# Patient Record
Sex: Male | Born: 1985 | Race: White | Hispanic: No | Marital: Married | Smoking: Never smoker
Health system: Southern US, Community
[De-identification: ages and names within clinical notes are randomized; demographics above are authoritative.]

## PROBLEM LIST (undated history)

## (undated) DIAGNOSIS — R112 Nausea with vomiting, unspecified: Secondary | ICD-10-CM

## (undated) DIAGNOSIS — M5430 Sciatica, unspecified side: Secondary | ICD-10-CM

## (undated) DIAGNOSIS — Z9889 Other specified postprocedural states: Secondary | ICD-10-CM

## (undated) DIAGNOSIS — R519 Headache, unspecified: Secondary | ICD-10-CM

## (undated) DIAGNOSIS — R51 Headache: Secondary | ICD-10-CM

## (undated) HISTORY — PX: OTHER SURGICAL HISTORY: SHX169

---

## 2012-07-15 ENCOUNTER — Emergency Department (HOSPITAL_BASED_OUTPATIENT_CLINIC_OR_DEPARTMENT_OTHER)
Admission: EM | Admit: 2012-07-15 | Discharge: 2012-07-15 | Disposition: A | Discharge status: Home / Self Care | Payer: BC Managed Care – PPO | Attending: Emergency Medicine | Admitting: Emergency Medicine

## 2012-07-15 ENCOUNTER — Encounter (HOSPITAL_BASED_OUTPATIENT_CLINIC_OR_DEPARTMENT_OTHER): Payer: Self-pay | Admitting: *Deleted

## 2012-07-15 ENCOUNTER — Emergency Department (HOSPITAL_BASED_OUTPATIENT_CLINIC_OR_DEPARTMENT_OTHER): Payer: BC Managed Care – PPO

## 2012-07-15 DIAGNOSIS — M545 Low back pain, unspecified: Secondary | ICD-10-CM | POA: Insufficient documentation

## 2012-07-15 DIAGNOSIS — M549 Dorsalgia, unspecified: Secondary | ICD-10-CM

## 2012-07-15 MED ORDER — OXYCODONE-ACETAMINOPHEN 5-325 MG PO TABS: 2.0000 | ORAL_TABLET | ORAL | Status: DC | PRN

## 2012-07-15 MED ORDER — DIAZEPAM 5 MG PO TABS: 5.0000 mg | ORAL_TABLET | Freq: Two times a day (BID) | ORAL | Status: DC

## 2012-07-15 MED ORDER — DIAZEPAM 5 MG/ML IJ SOLN
5.0000 mg | Freq: Once | INTRAMUSCULAR | Status: AC
  Administered 2012-07-15: 5 mg via INTRAMUSCULAR
  Filled 2012-07-15: qty 2

## 2012-07-15 MED ORDER — KETOROLAC TROMETHAMINE 30 MG/ML IJ SOLN
INTRAMUSCULAR | Status: AC
  Filled 2012-07-15: qty 1

## 2012-07-15 MED ORDER — KETOROLAC TROMETHAMINE 30 MG/ML IJ SOLN
60.0000 mg | Freq: Once | INTRAMUSCULAR | Status: AC
  Administered 2012-07-15: 60 mg via INTRAMUSCULAR
  Filled 2012-07-15: qty 1

## 2012-07-15 NOTE — ED Provider Notes (Signed)
History     CSN: [id number]  Arrival date & time 07/15/12  1844   First MD Initiated Contact with Patient 07/15/12 1839      Chief Complaint  Patient presents with  . Back Pain    (Consider location/radiation/quality/duration/timing/severity/associated sxs/prior treatment) HPI Comments: Pt states that he placed his daughter in her crib and he was unable to stand back up due to pain:pt denies fever,numbness, weakness or dysuria  Patient is a 27 y.o. male presenting with back pain. The history is provided by the patient. No language interpreter was used.  Back Pain Location:  Lumbar spine Quality:  Aching Pain severity:  Severe Onset quality:  Sudden Timing:  Constant Progression:  Unchanged Context: twisting   Relieved by:  Nothing Ineffective treatments:  Ibuprofen, heating pad and NSAIDs Associated symptoms: no bladder incontinence, no bowel incontinence, no numbness, no tingling and no weakness     History reviewed. No pertinent past medical history.  Past Surgical History  Procedure Laterality Date  . Knee surgery      History reviewed. No pertinent family history.  History  Substance Use Topics  . Smoking status: Never Smoker   . Smokeless tobacco: Not on file  . Alcohol Use: No      Review of Systems  Constitutional: Negative.   Respiratory: Negative.   Cardiovascular: Negative.   Gastrointestinal: Negative for bowel incontinence.  Genitourinary: Negative for bladder incontinence.  Musculoskeletal: Positive for back pain.  Neurological: Negative for tingling, weakness and numbness.    Allergies  Review of patient's allergies indicates no known allergies.  Home Medications  No current outpatient prescriptions on file.  There were no vitals taken for this visit.  Physical Exam  Nursing note and vitals reviewed. Constitutional: He is oriented to person, place, and time. He appears well-developed and well-nourished.  HENT:  Head: Normocephalic  and atraumatic.  Eyes: Pupils are equal, round, and reactive to light.  Neck: Normal range of motion. Neck supple.  Cardiovascular: Normal rate.   Pulmonary/Chest: Effort normal and breath sounds normal.  Musculoskeletal:  Lumbar spinal and paraspinal tenderness:pt has full rom of all extremities:no weakness noted  Neurological: He is alert and oriented to person, place, and time. He exhibits abnormal muscle tone. Coordination normal.  Skin: Skin is warm and dry.  Psychiatric: He has a normal mood and affect.    ED Course  Procedures (including critical care time)  Labs Reviewed - No data to display Dg Lumbar Spine Complete  07/15/2012  *RADIOLOGY REPORT*  Clinical Data: Back pain  LUMBAR SPINE - COMPLETE 4+ VIEW  Comparison: None.  Findings: Five lumbar-type vertebral bodies.  Straightening of the lumbar spine.  No evidence of fracture or dislocation.  The vertebral body heights and intervertebral disc spaces are maintained.  The visualized bony pelvis appears intact.  IMPRESSION: No fracture or dislocation is seen.   Original Report Authenticated By: Charline Bills, [name]      1. Back pain       MDM  Pt is ambulating standing straight MW:NUUV send home with symptomatic treatment:pt is okay to follow up with Dr. Pearletha Forge for continued symptoms:pt is not having any neuro deficits        Teressa Lower, NP 07/15/12 1957  Teressa Lower, NP 07/15/12 1959

## 2012-07-15 NOTE — ED Provider Notes (Signed)
Medical screening examination/treatment/procedure(s) were performed by non-physician practitioner and as supervising physician I was immediately available for consultation/collaboration.   Rolan Bucco, MD 07/15/12 2018

## 2012-07-15 NOTE — ED Notes (Signed)
1100  Mid back pain started states feels like a bowling ball in middle of his back radiated to mid buttocks and thighs  ems arrived pt lying on the floor

## 2014-11-09 ENCOUNTER — Encounter (HOSPITAL_COMMUNITY): Payer: Self-pay | Admitting: *Deleted

## 2014-11-09 ENCOUNTER — Ambulatory Visit (HOSPITAL_COMMUNITY)
Admission: RE | Admit: 2014-11-09 | Discharge: 2014-11-09 | Disposition: A | Discharge status: Home / Self Care | Payer: BC Managed Care – PPO | Source: Ambulatory Visit | Attending: Specialist | Admitting: Specialist

## 2014-11-09 ENCOUNTER — Ambulatory Visit: Payer: Self-pay | Admitting: Orthopedic Surgery

## 2014-11-09 NOTE — Progress Notes (Signed)
Please put orders in Epic for Same day surgery tomorrow, July 02-16 Thanks

## 2014-11-09 NOTE — Anesthesia Preprocedure Evaluation (Addendum)
Anesthesia Evaluation  Patient identified by MRN, date of birth, ID band Patient awake    Reviewed: Allergy & Precautions, NPO status , Patient's Chart, lab work & pertinent test results  Airway Mallampati: II  TM Distance: >3 FB Neck ROM: Full    Dental no notable dental hx.    Pulmonary neg pulmonary ROS,  breath sounds clear to auscultation  Pulmonary exam normal       Cardiovascular negative cardio ROS Normal cardiovascular examRhythm:Regular Rate:Normal     Neuro/Psych negative neurological ROS  negative psych ROS   GI/Hepatic negative GI ROS, Neg liver ROS,   Endo/Other  negative endocrine ROS  Renal/GU negative Renal ROS  negative genitourinary   Musculoskeletal negative musculoskeletal ROS (+)   Abdominal   Peds negative pediatric ROS (+)  Hematology negative hematology ROS (+)   Anesthesia Other Findings   Reproductive/Obstetrics negative OB ROS                             Anesthesia Physical Anesthesia Plan  ASA: II  Anesthesia Plan: General   Post-op Pain Management:    Induction: Intravenous  Airway Management Planned: Oral ETT  Additional Equipment:   Intra-op Plan:   Post-operative Plan: Extubation in OR  Informed Consent: I have reviewed the patients History and Physical, chart, labs and discussed the procedure including the risks, benefits and alternatives for the proposed anesthesia with the patient or authorized representative who has indicated his/her understanding and acceptance.   Dental advisory given  Plan Discussed with: CRNA  Anesthesia Plan Comments:         Anesthesia Quick Evaluation  

## 2014-11-09 NOTE — H&P (Signed)
Jeremy Ball is an 29 y.o. male.   Chief Complaint: back and leg pain HPI: PT seen in office today with 2 years back and leg pain (left initially, now progressed to right), intermittent, progressively worsening x several months with increased numbness, now with weakness in the leg, refractory to medications, activity modifications, relative rest.  No past medical history on file.  Past Surgical History  Procedure Laterality Date  . Knee surgery      No family history on file. Social History:  reports that he has never smoked. He does not have any smokeless tobacco history on file. He reports that he does not drink alcohol or use illicit drugs.  Allergies: No Known Allergies   (Not in a hospital admission)  No results found for this or any previous visit (from the past 48 hour(s)). No results found.  Review of Systems  Constitutional: Negative.   HENT: Negative.   Eyes: Negative.   Respiratory: Negative.   Cardiovascular: Negative.   Gastrointestinal: Negative.   Genitourinary: Negative.   Musculoskeletal: Positive for back pain.  Skin: Negative.   Neurological: Positive for sensory change and focal weakness.    There were no vitals taken for this visit. Physical Exam  Constitutional: He is oriented to person, place, and time. He appears well-developed and well-nourished.  HENT:  Head: Normocephalic.  Eyes: Pupils are equal, round, and reactive to light.  Neck: Normal range of motion.  Cardiovascular: Normal rate.   Respiratory: Effort normal.  GI: Soft.  Musculoskeletal:  + SLR left EHL weakness left Numbness L5 distribution No loss of rectal sphincter tone  Neurological: He is alert and oriented to person, place, and time.  Skin: Skin is warm and dry.  Psychiatric: He has a normal mood and affect.    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 Given his exam, neural tension signs, weakness, progressive symptoms, recommend proceeding with lumbar decompression L4-5, L5-S1 urgently tomorrow. Dr. Shelle IronBeane discussed risks, complications and alternatives with the pt. I had an extensive discussion of the risks and benefits of the lumbar decompression with the patient including bleeding, infection, damage to neurovascular structures, epidural fibrosis, CSF leak requiring repair. We also discussed increase in pain, adjacent segment disease, recurrent disc herniation, need for future surgery including repeat decompression and/or fusion. We also discussed risks of postoperative hematoma, paralysis, anesthetic complications including DVT, PE, death, cardiopulmonary dysfunction. In addition, the perioperative and postoperative courses were discussed in detail including the rehabilitative time and return to functional activity and work. I provided the patient with an illustrated handout and utilized the appropriate surgical models.  Plan microlumbar decompression L4-5, L5-S1  Jeremy Ball M. PA-C for Dr. Shelle IronBeane 11/09/2014, 2:28 PM

## 2014-11-09 NOTE — Progress Notes (Signed)
[facility] Dr. Shelle IronBeane requesting orders

## 2014-11-10 ENCOUNTER — Encounter (HOSPITAL_COMMUNITY): Payer: Self-pay | Admitting: *Deleted

## 2014-11-10 ENCOUNTER — Ambulatory Visit (HOSPITAL_COMMUNITY): Payer: BC Managed Care – PPO | Admitting: Anesthesiology

## 2014-11-10 ENCOUNTER — Ambulatory Visit (HOSPITAL_COMMUNITY): Payer: BC Managed Care – PPO

## 2014-11-10 ENCOUNTER — Ambulatory Visit (HOSPITAL_COMMUNITY)
Admission: RE | Admit: 2014-11-10 | Discharge: 2014-11-11 | Disposition: A | Discharge status: Home / Self Care | Payer: BC Managed Care – PPO | Source: Ambulatory Visit | Attending: Specialist | Admitting: Specialist

## 2014-11-10 ENCOUNTER — Encounter (HOSPITAL_COMMUNITY)
Admission: RE | Disposition: A | Discharge status: Home / Self Care | Payer: Self-pay | Source: Ambulatory Visit | Attending: Specialist

## 2014-11-10 DIAGNOSIS — Z79899 Other long term (current) drug therapy: Secondary | ICD-10-CM | POA: Insufficient documentation

## 2014-11-10 DIAGNOSIS — M545 Low back pain, unspecified: Secondary | ICD-10-CM | POA: Diagnosis present

## 2014-11-10 DIAGNOSIS — R531 Weakness: Secondary | ICD-10-CM | POA: Diagnosis not present

## 2014-11-10 DIAGNOSIS — M4806 Spinal stenosis, lumbar region: Secondary | ICD-10-CM | POA: Diagnosis present

## 2014-11-10 DIAGNOSIS — M5127 Other intervertebral disc displacement, lumbosacral region: Secondary | ICD-10-CM | POA: Insufficient documentation

## 2014-11-10 DIAGNOSIS — M5116 Intervertebral disc disorders with radiculopathy, lumbar region: Secondary | ICD-10-CM | POA: Insufficient documentation

## 2014-11-10 DIAGNOSIS — M5126 Other intervertebral disc displacement, lumbar region: Secondary | ICD-10-CM | POA: Diagnosis present

## 2014-11-10 DIAGNOSIS — Z419 Encounter for procedure for purposes other than remedying health state, unspecified: Secondary | ICD-10-CM

## 2014-11-10 HISTORY — PX: LUMBAR LAMINECTOMY/DECOMPRESSION MICRODISCECTOMY: SHX5026

## 2014-11-10 LAB — CBC
HEMATOCRIT: 48 % (ref 39.0–52.0)
HEMOGLOBIN: 15.9 g/dL (ref 13.0–17.0)
MCH: 28.1 pg (ref 26.0–34.0)
MCHC: 33.1 g/dL (ref 30.0–36.0)
MCV: 85 fL (ref 78.0–100.0)
Platelets: 260 10*3/uL (ref 150–400)
RBC: 5.65 MIL/uL (ref 4.22–5.81)
RDW: 12.8 % (ref 11.5–15.5)
WBC: 6.5 10*3/uL (ref 4.0–10.5)

## 2014-11-10 LAB — BASIC METABOLIC PANEL
Anion gap: 9 (ref 5–15)
BUN: 24 mg/dL — AB (ref 6–20)
CHLORIDE: 104 mmol/L (ref 101–111)
CO2: 25 mmol/L (ref 22–32)
CREATININE: 1.14 mg/dL (ref 0.61–1.24)
Calcium: 9.1 mg/dL (ref 8.9–10.3)
GFR calc non Af Amer: >60 mL/min (ref >60)
Glucose, Bld: 97 mg/dL (ref 65–99)
Potassium: 3.8 mmol/L (ref 3.5–5.1)
SODIUM: 138 mmol/L (ref 135–145)

## 2014-11-10 SURGERY — LUMBAR LAMINECTOMY/DECOMPRESSION MICRODISCECTOMY 2 LEVELS
Anesthesia: General

## 2014-11-10 MED ORDER — MIDAZOLAM HCL 5 MG/5ML IJ SOLN
INTRAMUSCULAR | Status: DC | PRN
  Administered 2014-11-10: 2 mg via INTRAVENOUS

## 2014-11-10 MED ORDER — BUPIVACAINE-EPINEPHRINE (PF) 0.5% -1:200000 IJ SOLN
INTRAMUSCULAR | Status: DC | PRN
  Administered 2014-11-10: 15 mL

## 2014-11-10 MED ORDER — DEXTROSE 5 % IV SOLN
3.0000 g | INTRAVENOUS | Status: AC
  Administered 2014-11-10: 3 g via INTRAVENOUS
  Filled 2014-11-10: qty 3000

## 2014-11-10 MED ORDER — OXYCODONE-ACETAMINOPHEN 5-325 MG PO TABS
1.0000 | ORAL_TABLET | ORAL | Status: DC | PRN
  Administered 2014-11-10 – 2014-11-11 (×3): 2 via ORAL
  Filled 2014-11-10 (×3): qty 2

## 2014-11-10 MED ORDER — SODIUM CHLORIDE 0.9 % IR SOLN
Status: DC | PRN
  Administered 2014-11-10: 500 mL

## 2014-11-10 MED ORDER — KCL IN DEXTROSE-NACL 20-5-0.45 MEQ/L-%-% IV SOLN
INTRAVENOUS | Status: DC
  Administered 2014-11-10: 13:00:00 via INTRAVENOUS
  Filled 2014-11-10 (×2): qty 1000

## 2014-11-10 MED ORDER — FENTANYL CITRATE (PF) 100 MCG/2ML IJ SOLN
INTRAMUSCULAR | Status: DC | PRN
  Administered 2014-11-10: 50 ug via INTRAVENOUS
  Administered 2014-11-10: 100 ug via INTRAVENOUS
  Administered 2014-11-10: 50 ug via INTRAVENOUS

## 2014-11-10 MED ORDER — PROPOFOL 10 MG/ML IV BOLUS
INTRAVENOUS | Status: DC | PRN
  Administered 2014-11-10: 250 mg via INTRAVENOUS

## 2014-11-10 MED ORDER — METHOCARBAMOL 1000 MG/10ML IJ SOLN
500.0000 mg | Freq: Four times a day (QID) | INTRAVENOUS | Status: DC | PRN
  Filled 2014-11-10: qty 5

## 2014-11-10 MED ORDER — HYDROMORPHONE HCL 1 MG/ML IJ SOLN
0.5000 mg | INTRAMUSCULAR | Status: DC | PRN
  Administered 2014-11-10: 0.5 mg via INTRAVENOUS
  Filled 2014-11-10: qty 1

## 2014-11-10 MED ORDER — METHOCARBAMOL 500 MG PO TABS
500.0000 mg | ORAL_TABLET | Freq: Four times a day (QID) | ORAL | Status: DC | PRN
  Administered 2014-11-10 – 2014-11-11 (×2): 500 mg via ORAL
  Filled 2014-11-10 (×2): qty 1

## 2014-11-10 MED ORDER — HYDROMORPHONE HCL 1 MG/ML IJ SOLN
INTRAMUSCULAR | Status: AC
  Administered 2014-11-10: 0.5 mg via INTRAVENOUS
  Filled 2014-11-10: qty 1

## 2014-11-10 MED ORDER — ONDANSETRON HCL 4 MG/2ML IJ SOLN
INTRAMUSCULAR | Status: DC | PRN
  Administered 2014-11-10: 4 mg via INTRAVENOUS

## 2014-11-10 MED ORDER — HYDROMORPHONE HCL 1 MG/ML IJ SOLN
0.2500 mg | INTRAMUSCULAR | Status: DC | PRN
  Administered 2014-11-10: 0.5 mg via INTRAVENOUS

## 2014-11-10 MED ORDER — DEXAMETHASONE SODIUM PHOSPHATE 10 MG/ML IJ SOLN
INTRAMUSCULAR | Status: AC
  Filled 2014-11-10: qty 1

## 2014-11-10 MED ORDER — SODIUM CHLORIDE 0.9 % IR SOLN
Status: AC
  Filled 2014-11-10: qty 1

## 2014-11-10 MED ORDER — CLINDAMYCIN PHOSPHATE 900 MG/50ML IV SOLN
INTRAVENOUS | Status: AC
  Filled 2014-11-10: qty 50

## 2014-11-10 MED ORDER — METHOCARBAMOL 500 MG PO TABS: 500.0000 mg | ORAL_TABLET | Freq: Three times a day (TID) | ORAL | Status: DC | PRN

## 2014-11-10 MED ORDER — CLINDAMYCIN PHOSPHATE 900 MG/50ML IV SOLN
900.0000 mg | INTRAVENOUS | Status: AC
  Administered 2014-11-10: 900 mg via INTRAVENOUS

## 2014-11-10 MED ORDER — CEFAZOLIN SODIUM 10 G IJ SOLR
3.0000 g | Freq: Three times a day (TID) | INTRAMUSCULAR | Status: AC
  Administered 2014-11-10 – 2014-11-11 (×3): 3 g via INTRAVENOUS
  Filled 2014-11-10 (×3): qty 3000

## 2014-11-10 MED ORDER — RISAQUAD PO CAPS
1.0000 | ORAL_CAPSULE | Freq: Every day | ORAL | Status: DC
  Administered 2014-11-10: 1 via ORAL
  Filled 2014-11-10 (×2): qty 1

## 2014-11-10 MED ORDER — PROPOFOL 10 MG/ML IV BOLUS
INTRAVENOUS | Status: AC
  Filled 2014-11-10: qty 20

## 2014-11-10 MED ORDER — NEOSTIGMINE METHYLSULFATE 10 MG/10ML IV SOLN
INTRAVENOUS | Status: AC
  Filled 2014-11-10: qty 1

## 2014-11-10 MED ORDER — MIDAZOLAM HCL 2 MG/2ML IJ SOLN
INTRAMUSCULAR | Status: AC
  Filled 2014-11-10: qty 2

## 2014-11-10 MED ORDER — HYDROCODONE-ACETAMINOPHEN 5-325 MG PO TABS: 1.0000 | ORAL_TABLET | ORAL | Status: DC | PRN

## 2014-11-10 MED ORDER — DEXAMETHASONE SODIUM PHOSPHATE 10 MG/ML IJ SOLN
INTRAMUSCULAR | Status: DC | PRN
  Administered 2014-11-10: 10 mg via INTRAVENOUS

## 2014-11-10 MED ORDER — LACTATED RINGERS IV SOLN
INTRAVENOUS | Status: DC
  Administered 2014-11-10 (×2): via INTRAVENOUS

## 2014-11-10 MED ORDER — NEOSTIGMINE METHYLSULFATE 10 MG/10ML IV SOLN
INTRAVENOUS | Status: DC | PRN
Start: 2014-11-10 — End: 2014-11-10
  Administered 2014-11-10: 5 mg via INTRAVENOUS

## 2014-11-10 MED ORDER — ONDANSETRON HCL 4 MG/2ML IJ SOLN
INTRAMUSCULAR | Status: AC
  Filled 2014-11-10: qty 2

## 2014-11-10 MED ORDER — LIDOCAINE HCL (CARDIAC) 20 MG/ML IV SOLN
INTRAVENOUS | Status: AC
  Filled 2014-11-10: qty 5

## 2014-11-10 MED ORDER — EPHEDRINE SULFATE 50 MG/ML IJ SOLN
INTRAMUSCULAR | Status: AC
  Filled 2014-11-10: qty 1

## 2014-11-10 MED ORDER — LIDOCAINE HCL (CARDIAC) 20 MG/ML IV SOLN
INTRAVENOUS | Status: DC | PRN
  Administered 2014-11-10: 100 mg via INTRAVENOUS

## 2014-11-10 MED ORDER — CLINDAMYCIN PHOSPHATE 900 MG/50ML IV SOLN
900.0000 mg | Freq: Once | INTRAVENOUS | Status: AC
  Administered 2014-11-10: 900 mg via INTRAVENOUS
  Filled 2014-11-10: qty 50

## 2014-11-10 MED ORDER — DOCUSATE SODIUM 100 MG PO CAPS: 100.0000 mg | ORAL_CAPSULE | Freq: Two times a day (BID) | ORAL | Status: DC | PRN

## 2014-11-10 MED ORDER — THROMBIN 5000 UNITS EX SOLR
CUTANEOUS | Status: DC | PRN
Start: 2014-11-10 — End: 2014-11-10
  Administered 2014-11-10: 10000 [IU] via TOPICAL

## 2014-11-10 MED ORDER — MENTHOL 3 MG MT LOZG: 1.0000 | LOZENGE | OROMUCOSAL | Status: DC | PRN

## 2014-11-10 MED ORDER — GLYCOPYRROLATE 0.2 MG/ML IJ SOLN
INTRAMUSCULAR | Status: AC
  Filled 2014-11-10: qty 3

## 2014-11-10 MED ORDER — GLYCOPYRROLATE 0.2 MG/ML IJ SOLN
INTRAMUSCULAR | Status: DC | PRN
  Administered 2014-11-10: .7 mg via INTRAVENOUS

## 2014-11-10 MED ORDER — BUPIVACAINE-EPINEPHRINE (PF) 0.5% -1:200000 IJ SOLN
INTRAMUSCULAR | Status: AC
  Filled 2014-11-10: qty 30

## 2014-11-10 MED ORDER — ONDANSETRON HCL 4 MG/2ML IJ SOLN
4.0000 mg | INTRAMUSCULAR | Status: DC | PRN
  Administered 2014-11-10: 4 mg via INTRAVENOUS
  Filled 2014-11-10: qty 2

## 2014-11-10 MED ORDER — FENTANYL CITRATE (PF) 250 MCG/5ML IJ SOLN
INTRAMUSCULAR | Status: AC
  Filled 2014-11-10: qty 5

## 2014-11-10 MED ORDER — SENNOSIDES-DOCUSATE SODIUM 8.6-50 MG PO TABS: 1.0000 | ORAL_TABLET | Freq: Every evening | ORAL | Status: DC | PRN

## 2014-11-10 MED ORDER — BISACODYL 5 MG PO TBEC: 5.0000 mg | DELAYED_RELEASE_TABLET | Freq: Every day | ORAL | Status: DC | PRN

## 2014-11-10 MED ORDER — OXYCODONE-ACETAMINOPHEN 5-325 MG PO TABS: 1.0000 | ORAL_TABLET | ORAL | Status: DC | PRN

## 2014-11-10 MED ORDER — ROCURONIUM BROMIDE 100 MG/10ML IV SOLN
INTRAVENOUS | Status: DC | PRN
  Administered 2014-11-10: 60 mg via INTRAVENOUS
  Administered 2014-11-10 (×3): 10 mg via INTRAVENOUS
  Administered 2014-11-10: 5 mg via INTRAVENOUS

## 2014-11-10 MED ORDER — DOCUSATE SODIUM 100 MG PO CAPS
100.0000 mg | ORAL_CAPSULE | Freq: Two times a day (BID) | ORAL | Status: DC
  Administered 2014-11-10: 100 mg via ORAL

## 2014-11-10 MED ORDER — THROMBIN 5000 UNITS EX SOLR
CUTANEOUS | Status: AC
  Filled 2014-11-10: qty 10000

## 2014-11-10 MED ORDER — ACETAMINOPHEN 325 MG PO TABS: 650.0000 mg | ORAL_TABLET | ORAL | Status: DC | PRN

## 2014-11-10 MED ORDER — ACETAMINOPHEN 650 MG RE SUPP: 650.0000 mg | RECTAL | Status: DC | PRN

## 2014-11-10 MED ORDER — ALUM & MAG HYDROXIDE-SIMETH 200-200-20 MG/5ML PO SUSP: 30.0000 mL | Freq: Four times a day (QID) | ORAL | Status: DC | PRN

## 2014-11-10 MED ORDER — ROCURONIUM BROMIDE 100 MG/10ML IV SOLN
INTRAVENOUS | Status: AC
  Filled 2014-11-10: qty 1

## 2014-11-10 MED ORDER — MAGNESIUM CITRATE PO SOLN: 1.0000 | Freq: Once | ORAL | Status: AC | PRN

## 2014-11-10 MED ORDER — SODIUM CHLORIDE 0.9 % IJ SOLN
INTRAMUSCULAR | Status: AC
  Filled 2014-11-10: qty 10

## 2014-11-10 MED ORDER — PHENOL 1.4 % MT LIQD: 1.0000 | OROMUCOSAL | Status: DC | PRN

## 2014-11-10 SURGICAL SUPPLY — 47 items
BAG ZIPLOCK 12X15 (MISCELLANEOUS) IMPLANT
CHLORAPREP W/TINT 26ML (MISCELLANEOUS) IMPLANT
CLEANER TIP ELECTROSURG 2X2 (MISCELLANEOUS) ×3 IMPLANT
CLOSURE WOUND 1/2 X4 (GAUZE/BANDAGES/DRESSINGS) ×1
CLOTH 2% CHLOROHEXIDINE 3PK (PERSONAL CARE ITEMS) ×6 IMPLANT
DRAPE MICROSCOPE LEICA (MISCELLANEOUS) ×3 IMPLANT
DRAPE POUCH INSTRU U-SHP 10X18 (DRAPES) ×3 IMPLANT
DRAPE SHEET LG 3/4 BI-LAMINATE (DRAPES) ×3 IMPLANT
DRAPE SURG 17X11 SM STRL (DRAPES) ×3 IMPLANT
DRAPE UTILITY XL STRL (DRAPES) ×3 IMPLANT
DRSG AQUACEL AG ADV 3.5X 4 (GAUZE/BANDAGES/DRESSINGS) IMPLANT
DRSG AQUACEL AG ADV 3.5X 6 (GAUZE/BANDAGES/DRESSINGS) IMPLANT
DURAPREP 26ML APPLICATOR (WOUND CARE) ×3 IMPLANT
DURASEAL SPINE SEALANT 3ML (MISCELLANEOUS) IMPLANT
ELECT BLADE TIP CTD 4 INCH (ELECTRODE) ×3 IMPLANT
ELECT REM PT RETURN 9FT ADLT (ELECTROSURGICAL) ×3
ELECTRODE REM PT RTRN 9FT ADLT (ELECTROSURGICAL) ×1 IMPLANT
GLOVE BIOGEL PI IND STRL 7.5 (GLOVE) ×1 IMPLANT
GLOVE BIOGEL PI INDICATOR 7.5 (GLOVE) ×2
GLOVE SURG SS PI 7.5 STRL IVOR (GLOVE) ×3 IMPLANT
GLOVE SURG SS PI 8.0 STRL IVOR (GLOVE) ×6 IMPLANT
GOWN STRL REUS W/TWL XL LVL3 (GOWN DISPOSABLE) ×9 IMPLANT
IV CATH 14GX2 1/4 (CATHETERS) IMPLANT
IV CATH AUTO 14GX1.75 SAFE ORG (IV SOLUTION) ×3 IMPLANT
KIT BASIN OR (CUSTOM PROCEDURE TRAY) ×3 IMPLANT
KIT POSITIONING SURG ANDREWS (MISCELLANEOUS) ×3 IMPLANT
MANIFOLD NEPTUNE II (INSTRUMENTS) ×3 IMPLANT
NEEDLE SPNL 18GX3.5 QUINCKE PK (NEEDLE) ×6 IMPLANT
PACK LAMINECTOMY ORTHO (CUSTOM PROCEDURE TRAY) ×3 IMPLANT
PATTIES SURGICAL .5 X.5 (GAUZE/BANDAGES/DRESSINGS) IMPLANT
PATTIES SURGICAL .75X.75 (GAUZE/BANDAGES/DRESSINGS) ×3 IMPLANT
PATTIES SURGICAL 1X1 (DISPOSABLE) IMPLANT
PEN SKIN MARKING BROAD (MISCELLANEOUS) ×3 IMPLANT
SPONGE SURGIFOAM ABS GEL 100 (HEMOSTASIS) ×3 IMPLANT
STRIP CLOSURE SKIN 1/2X4 (GAUZE/BANDAGES/DRESSINGS) ×2 IMPLANT
SUT NURALON 4 0 TR CR/8 (SUTURE) IMPLANT
SUT PROLENE 3 0 PS 2 (SUTURE) IMPLANT
SUT VIC AB 1 CT1 27 (SUTURE) ×6
SUT VIC AB 1 CT1 27XBRD ANTBC (SUTURE) ×3 IMPLANT
SUT VIC AB 1-0 CT2 27 (SUTURE) IMPLANT
SUT VIC AB 2-0 CT1 27 (SUTURE) ×2
SUT VIC AB 2-0 CT1 TAPERPNT 27 (SUTURE) ×1 IMPLANT
SUT VIC AB 2-0 CT2 27 (SUTURE) ×3 IMPLANT
SYR 3ML LL SCALE MARK (SYRINGE) ×3 IMPLANT
TOWEL OR 17X26 10 PK STRL BLUE (TOWEL DISPOSABLE) ×3 IMPLANT
TOWEL OR NON WOVEN STRL DISP B (DISPOSABLE) ×3 IMPLANT
YANKAUER SUCT BULB TIP NO VENT (SUCTIONS) ×3 IMPLANT

## 2014-11-10 NOTE — Anesthesia Procedure Notes (Signed)
Procedure Name: Intubation Date/Time: 11/10/2014 7:38 AM Performed by: Thornell MuleSTUBBLEFIELD, Symiah Nowotny G Pre-anesthesia Checklist: Patient identified, Emergency Drugs available, Suction available and Patient being monitored Patient Re-evaluated:Patient Re-evaluated prior to inductionOxygen Delivery Method: Circle System Utilized Preoxygenation: Pre-oxygenation with 100% oxygen Intubation Type: IV induction Ventilation: Mask ventilation without difficulty Grade View: Grade I Tube type: Oral Tube size: 7.5 mm Number of attempts: 1 Airway Equipment and Method: Stylet and Oral airway Placement Confirmation: ETT inserted through vocal cords under direct vision,  positive ETCO2 and breath sounds checked- equal and bilateral Secured at: 22 cm Tube secured with: Tape Dental Injury: Teeth and Oropharynx as per pre-operative assessment

## 2014-11-10 NOTE — Addendum Note (Signed)
Addendum  created 11/10/14 1153 by Thornell MuleHoward G Syleena Mchan, CRNA   Modules edited: Charges VN

## 2014-11-10 NOTE — Brief Op Note (Signed)
11/10/2014  10:27 AM  PATIENT:  Jeremy Ball  29 y.o. 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   lumbar five to sacral one  POST-OPERATIVE DIAGNOSIS:  spinal stenosis L4-L5, L5-S1, HNP  PROCEDURE:  Procedure(s): LUMBAR LAMINECTOMY/DECOMPRESSION MICRODISCECTOMY 2 LEVELS (N/A)  SURGEON:  Surgeon(s) and Role:    * Jene EveryJeffrey Danali Marinos, MD - Primary    * Ranee Gosselinonald Gioffre, MD - Assisting  PHYSICIAN ASSISTANT:   ASSISTANTS: Gioffre   ANESTHESIA:   general  EBL:  Total I/O In: 1000 [I.V.:1000] Out: 200 [Urine:150; Blood:50]  BLOOD ADMINISTERED:none  DRAINS: none   LOCAL MEDICATIONS USED:  MARCAINE     SPECIMEN:  Source of Specimen:  L5S1  DISPOSITION OF SPECIMEN:  PATHOLOGY  COUNTS:  YES  TOURNIQUET:  * No tourniquets in log *  DICTATION: .Other Dictation: Dictation Number  [id number]  PLAN OF CARE: Admit to inpatient   PATIENT DISPOSITION:  PACU - hemodynamically stable.   Delay start of Pharmacological VTE agent (>24hrs) due to surgical blood loss or risk of bleeding: yes

## 2014-11-10 NOTE — Op Note (Signed)
NAMMarnee Guarneri:  Salak, Kazuto              ACCOUNT NO.:  [id number]  MEDICAL RECORD NO.:  [id number]  LOCATION:  1601                         [facility]  PHYSICIAN:  Jene EveryJeffrey Meilech Virts, [name]    DATE OF BIRTH:  Jun 09, 1985  DATE OF PROCEDURE:  11/10/2014 DATE OF DISCHARGE:                              OPERATIVE REPORT   PREOPERATIVE DIAGNOSIS:  Spinal stenosis, herniated nucleus pulposus, L5- S1, L4-5.  POSTOPERATIVE DIAGNOSIS:  Spinal stenosis, herniated nucleus pulposus, L5-S1, L4-5.  PROCEDURES PERFORMED:  Microlumbar decompression, L4-5, L5-S1 with a central laminectomy of L5, foraminotomies of L5-S1, microdiskectomy 5-1.  ANESTHESIA:  General.  ASSISTANT:  Georges Lynchonald A. Gioffre, [name]  BRIEF HISTORY:  This is a 29 year old male, presented yesterday afternoon into the office with an extruded disk herniation in the L5-S1. He had severe pain, numbness, and weakness in the lower extremity.  The patient reports since this past Sunday, he has had symptoms, actually his numbness had gotten just slightly better, but he had some intermittent episodes of perceived incomplete voiding.  He had an exam in the office including a rectal exam where he had tone and perianal sensation.  With decreased sensation in the left buttock and down into the left thigh, given the compression of thecal sac and the disk herniation, I recommended decompression to avoid cauda equina syndrome, neurologic deficit, pain relief, etc.  Risks and benefits discussed including bleeding, infection, damage to neurovascular structure, DVT, PE, anesthetic complications, etc.  TECHNIQUE:  Patient in supine position, after induction of adequate general anesthesia, 3 g Kefzol and 900 clindamycin, he was placed prone on the [place] frame.  All bony prominences were well padded, Foley to gravity.  Lumbar region was prepped and draped in usual sterile fashion. Two 18-gauge spinal needle was utilized to localize L4-5 and  L5-S1 interspace.  Incision was made from spinous process forwarded down to S1.  Subcutaneous tissue was dissected.  Electrocautery was utilized to achieve hemostasis.  Dorsolumbar fascia identified via the line of skin incision.  Paraspinous muscle elevated from L4-5 and L5-S1.  Operating microscope was draped and brought on the surgical field.  We confirmed the level with Kocher's on the spinous processes.  Leksell rongeur was utilized to remove the spinous process of L5.  Ligamentum flavum removed from 4-5 and at 5-1.  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  Due to the compression of the thecal sac, it was felt a decompression by the removal of lamina of 5 and the ligamentum essentially would be the most prudent approach.  Following this, removal of the spinous process, I used a micro curette and detached the ligamentum flavum very meticulously from the caudad edge of 5 bilaterally and then placed a neuro probe between the thecal sac and the lamina.  We then proceeded to remove the central lamina of 5, protecting the neural elements at all times.  Following the removal of lamina and detaching the ligamentum, we then detached ligamentum flavum into the lateral recesses with a straight curette.  With the neural probe just protecting the thecal sac and the ligamentum flavum, this was detached on the right at 5-1 down to the cephalad edge  of S1 and on the left, as we detached ligamentum flavum from its lateral attachments, disk material was seen completely into the dorsum of the thecal sac compressing the thecal sac.  We continued to detach ligamentum flavum from lateral recess at 5-1.  I then removed a large dorsal fragment of the disk herniation for provisional decompression.  We then continued with our decompression.  I detached the ligamentum flavum from the cephalad edge of S1 utilizing micro curette straight.  First on the lateral recesses at 5-1, I  performed a foraminotomy of S1 bilaterally for further mobilization.  Ligamentum flavum was still centrally attached at S1.  I then went to the left side and removed 2 additional large fragments from the lateral aspect of the thecal sac extending into in between the root of 5 and S1, two large additional fragments and then we protected the 5 root and the S1 root in the thecal sac and removed the disk herniation extending down into the disk space from 5-1. Multiple fragments were removed.  This was a huge disk herniation and significant compression was noted to the thecal sac, and prompted the circumferential release prior to removing the disk.  Following this, removed ligamentum flavum from the interspace at 4-5 as well.  We then came across a midline of the dorsum, detached the ligamentum flavum with 2 mm Kerrison, then removed some ligamentum over the lateral side, lifted the ligamentum flavum from the dorsum.  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  It was dura in content, it was not blebs or leaking fluid.  It was not thin.  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  We did discuss variety of options with the assistant, Dr. Darrelyn Hillock.  We felt leaving the ligamentum flavum on top of this would be most prudent, since we were decompressed on both sides, we had performed foraminotomies of S1 and 5. Neural probe was passed freely at the foramen of L4 and 5.  We felt there was an excellent decompression.  There was no compression upon the thecal sac.  There was good restoration of the thecal sac.  We copiously irrigated the antibiotic irrigation as well as the disk space.  We obtained the confirmatory radiograph.  No evidence of CSF leakage or active bleeding, and performed another Valsalva with the  ligamentum flavum back over top of the dorsum.  Again, no central compression was noted.  We removed the [facility] retractor, irrigated the paraspinous musculature.  We repaired the dorsolumbar fascia with #1 Vicryl interrupted figure-of-eight sutures, subcu with multiple 2-0 was due to the patient's size and the skin with staples.  Irrigating throughout. The patient tolerated the procedure well.  There were no complications. Assistant, Dr. Darrelyn Hillock.  Blood loss 25 mL.     Jene Every, [name]     Cordelia Pen  D:  11/10/2014  T:  11/10/2014  Job:  [id number]

## 2014-11-10 NOTE — Interval H&P Note (Signed)
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 After consideration of risks, benefits and other options for treatment, the patient has consented to  Procedure(s): LUMBAR LAMINECTOMY/DECOMPRESSION MICRODISCECTOMY 2 LEVELS (N/A) as a surgical intervention .  The patient's history has been reviewed, patient examined, no change in status, stable for surgery.  I have reviewed the patient's chart and labs.  Questions were answered to the patient's satisfaction.     Namiah Dunnavant C

## 2014-11-10 NOTE — Transfer of Care (Signed)
Immediate Anesthesia Transfer of Care Note  Patient: Jeremy Ball  Procedure(s) Performed: Procedure(s): LUMBAR LAMINECTOMY/DECOMPRESSION MICRODISCECTOMY 2 LEVELS (N/A)  Patient Location: PACU  Anesthesia Type:General  Level of Consciousness: awake, alert  and oriented  Airway & Oxygen Therapy: Patient Spontanous Breathing and Patient connected to face mask oxygen  Post-op Assessment: Report given to RN and Post -op Vital signs reviewed and stable  Post vital signs: Reviewed and stable  Last Vitals:  Filed Vitals:   11/10/14 0530  BP: 158/97  Pulse: 69  Temp: 36.3 C  Resp: 16    Complications: No apparent anesthesia complications

## 2014-11-10 NOTE — Anesthesia Postprocedure Evaluation (Signed)
  Anesthesia Post-op Note  Patient: Jeremy Ball  Procedure(s) Performed: Procedure(s) (LRB): LUMBAR LAMINECTOMY/DECOMPRESSION MICRODISCECTOMY 2 LEVELS (N/A)  Patient Location: PACU  Anesthesia Type: General  Level of Consciousness: awake and alert   Airway and Oxygen Therapy: Patient Spontanous Breathing  Post-op Pain: mild  Post-op Assessment: Post-op Vital signs reviewed, Patient's Cardiovascular Status Stable, Respiratory Function Stable, Patent Airway and No signs of Nausea or vomiting  Last Vitals:  Filed Vitals:   11/10/14 1045  BP: 122/48  Pulse: 71  Temp:   Resp: 19    Post-op Vital Signs: stable   Complications: No apparent anesthesia complications

## 2014-11-10 NOTE — H&P (View-Only) (Signed)
Jeremy Ball is an 29 y.o. male.   Chief Complaint: back and leg pain HPI: PT seen in office today with 2 years back and leg pain (left initially, now progressed to right), intermittent, progressively worsening x several months with increased numbness, now with weakness in the leg, refractory to medications, activity modifications, relative rest.  No past medical history on file.  Past Surgical History  Procedure Laterality Date  . Knee surgery      No family history on file. Social History:  reports that he has never smoked. He does not have any smokeless tobacco history on file. He reports that he does not drink alcohol or use illicit drugs.  Allergies: No Known Allergies   (Not in a hospital admission)  No results found for this or any previous visit (from the past 48 hour(s)). No results found.  Review of Systems  Constitutional: Negative.   HENT: Negative.   Eyes: Negative.   Respiratory: Negative.   Cardiovascular: Negative.   Gastrointestinal: Negative.   Genitourinary: Negative.   Musculoskeletal: Positive for back pain.  Skin: Negative.   Neurological: Positive for sensory change and focal weakness.    There were no vitals taken for this visit. Physical Exam  Constitutional: He is oriented to person, place, and time. He appears well-developed and well-nourished.  HENT:  Head: Normocephalic.  Eyes: Pupils are equal, round, and reactive to light.  Neck: Normal range of motion.  Cardiovascular: Normal rate.   Respiratory: Effort normal.  GI: Soft.  Musculoskeletal:  + SLR left EHL weakness left Numbness L5 distribution No loss of rectal sphincter tone  Neurological: He is alert and oriented to person, place, and time.  Skin: Skin is warm and dry.  Psychiatric: He has a normal mood and affect.    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 Given his exam, neural tension signs, weakness, progressive symptoms, recommend proceeding with lumbar decompression L4-5, L5-S1 urgently tomorrow. Dr. Beane discussed risks, complications and alternatives with the pt. I had an extensive discussion of the risks and benefits of the lumbar decompression with the patient including bleeding, infection, damage to neurovascular structures, epidural fibrosis, CSF leak requiring repair. We also discussed increase in pain, adjacent segment disease, recurrent disc herniation, need for future surgery including repeat decompression and/or fusion. We also discussed risks of postoperative hematoma, paralysis, anesthetic complications including DVT, PE, death, cardiopulmonary dysfunction. In addition, the perioperative and postoperative courses were discussed in detail including the rehabilitative time and return to functional activity and work. I provided the patient with an illustrated handout and utilized the appropriate surgical models.  Plan microlumbar decompression L4-5, L5-S1  Chassidy Layson M. PA-C for Dr. Beane 11/09/2014, 2:28 PM    

## 2014-11-11 DIAGNOSIS — M5116 Intervertebral disc disorders with radiculopathy, lumbar region: Secondary | ICD-10-CM | POA: Diagnosis not present

## 2014-11-11 NOTE — Progress Notes (Signed)
   Subjective:  Patient reports pain as mild.  Radicular LLE pain resolved. Denies N/V/CP/SOB.  Objective:   VITALS:   Filed Vitals:   11/10/14 1558 11/10/14 1837 11/10/14 2200 11/11/14 0200  BP: 125/71 122/73 121/57 135/64  Pulse: 87 82 75 80  Temp: 97.3 F (36.3 C) 98 F (36.7 C) 97.9 F (36.6 C) 98.2 F (36.8 C)  TempSrc: Axillary Oral Oral Oral  Resp: 16 18 16 16   Height:      Weight:      SpO2: 97% 96% 97% 97%    ABD soft Intact pulses distally Dorsiflexion/Plantar flexion intact Incision: scant drainage No cellulitis present   Lab Results  Component Value Date   WBC 6.5 11/10/2014   HGB 15.9 11/10/2014   HCT 48.0 11/10/2014   MCV 85.0 11/10/2014   PLT 260 11/10/2014   BMET    Component Value Date/Time   NA 138 11/10/2014 0550   K 3.8 11/10/2014 0550   CL 104 11/10/2014 0550   CO2 25 11/10/2014 0550   GLUCOSE 97 11/10/2014 0550   BUN 24* 11/10/2014 0550   CREATININE 1.14 11/10/2014 0550   CALCIUM 9.1 11/10/2014 0550   GFRNONAA >60 11/10/2014 0550   GFRAA >60 11/10/2014 0550     Assessment/Plan: 1 Day Post-Op   Active Problems:   HNP (herniated nucleus pulposus), lumbar   Lumbar back pain   Advance diet Up with therapy D/C home   Jeremy Ball, Cloyde ReamsBrian James 11/11/2014, 7:49 AM   Samson FredericBrian Autie Vasudevan, MD Cell [phone]

## 2014-11-11 NOTE — Progress Notes (Signed)
Occupational Therapy Evaluation Patient Details Name: Jeremy Ball MRN: [id number] DOB: [birth year] Today's Date: 11/11/2014    History of Present Illness 29 yo M s/p Microlumbar decompression, L4-5, L5-S1    Clinical Impression   Patient presents to OT with decreased ADL independence as expected post-op. All education completed and patient has no further OT needs. Will sign off.    Follow Up Recommendations  No OT follow up;Supervision - Intermittent    Equipment Recommendations  None recommended by OT    Recommendations for Other Services       Precautions / Restrictions Precautions Precautions: Back Precaution Booklet Issued: Yes (comment) Restrictions Weight Bearing Restrictions: No      Mobility Bed Mobility Overal bed mobility: Modified Independent (log roll technique)                Transfers Overall transfer level: Modified independent Equipment used: None             General transfer comment: Patient performed toilet transfer regular toilet modified independent, ambulated in hall no AD independent, and went up/down flight of stairs per his request modified I with rail.    Balance                                            ADL Overall ADL's : Modified independent (PRN minimal assistance by wife with LB self-care)                                             Vision     Perception     Praxis      Pertinent Vitals/Pain Pain Assessment: No/denies pain     Hand Dominance Right   Extremity/Trunk Assessment Upper Extremity Assessment Upper Extremity Assessment: Overall WFL for tasks assessed   Lower Extremity Assessment Lower Extremity Assessment: Overall WFL for tasks assessed   Cervical / Trunk Assessment Cervical / Trunk Assessment: Normal   Communication Communication Communication: No difficulties   Cognition Arousal/Alertness: Awake/alert Behavior During Therapy: WFL for tasks  assessed/performed Overall Cognitive Status: Within Functional Limits for tasks assessed                     General Comments       Exercises       Shoulder Instructions      Home Living Family/patient expects to be discharged to:: Private residence Living Arrangements: Spouse/significant other Available Help at Discharge: Family;Available 24 hours/day Type of Home: House       Home Layout: Two level Alternate Level Stairs-Number of Steps: 12 Alternate Level Stairs-Rails: Right Bathroom Shower/Tub: Producer, television/film/videoWalk-in shower   Bathroom Toilet: Standard     Home Equipment: None          Prior Functioning/Environment Level of Independence: Independent             OT Diagnosis: Other (comment) (postop back surgery)   OT Problem List: Decreased knowledge of precautions   OT Treatment/Interventions:      OT Goals(Current goals can be found in the care plan section) Acute Rehab OT Goals Patient Stated Goal: to go home today OT Goal Formulation: All assessment and education complete, DC therapy  OT Frequency:     Barriers to D/C:  Co-evaluation              End of Session Nurse Communication: Mobility status  Activity Tolerance: Patient tolerated treatment well Patient left: in bed;with call bell/phone within reach;with family/visitor present   Time: 0920-0935 OT Time Calculation (min): 15 min Charges:  OT General Charges $OT Visit: 1 Procedure OT Evaluation $Initial OT Evaluation Tier I: 1 Procedure G-Codes: OT G-codes **NOT FOR INPATIENT CLASS** Functional Limitation: Self care Self Care Current Status (Z6109): At least 20 percent but less than 40 percent impaired, limited or restricted Self Care Goal Status (U0454): At least 1 percent but less than 20 percent impaired, limited or restricted Self Care Discharge Status [phone]): At least 20 percent but less than 40 percent impaired, limited or restricted  Jeremy Ball A 11/11/2014, 10:32  AM

## 2014-11-11 NOTE — Progress Notes (Signed)
PT Cancellation Note  Patient Details Name: Jeremy AlstromZachary Ball MRN: [id number] DOB: Dec 22, 1985   Cancelled Treatment:       Pt screened with several mobility questions answered.  Pt has been mobilizing in halls with nursing and experiencing no difficulties.  PT to sign off at this time   [facility] HospitalBRADSHAW,Christyan Reger 11/11/2014, 8:42 AM

## 2014-11-11 NOTE — Progress Notes (Signed)
Wasted 0.5 mg Dilaudid with co worker 11/11/2014 @ 18:45. [facility] to waste [facility]

## 2014-11-11 NOTE — Progress Notes (Signed)
Discharged from floor ambulatory, belongings & family with pt. No changes in assessment. Jeremy Ball, [employer] Bath [employer]

## 2014-11-12 NOTE — Discharge Summary (Signed)
Physician Discharge Summary   Patient ID: Jeremy Ball MRN: [id number] DOB/AGE: 10-17-1985 29 y.o.  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 No pertinent past medical history. Discharge Diagnoses:   Active Problems:   HNP (herniated nucleus pulposus), lumbar   Lumbar back pain  Procedure:  Procedure(s) (LRB): LUMBAR LAMINECTOMY/DECOMPRESSION MICRODISCECTOMY 2 LEVELS (N/A)   Consults: None  HPI:  see H&P    Laboratory Data: No results found for any previous visit.  Recent Labs  11/10/14 0550  HGB 15.9    Recent Labs  11/10/14 0550  WBC 6.5  RBC 5.65  HCT 48.0  PLT 260    Recent Labs  11/10/14 0550  NA 138  K 3.8  CL 104  CO2 25  BUN 24*  CREATININE 1.14  GLUCOSE 97  CALCIUM 9.1   No results for input(s): LABPT, INR in the last 72 hours.  X-Rays:Dg Lumbar Spine 2-3 Views  11/10/2014   CLINICAL DATA:  Herniated lumbar disc.  EXAM: LUMBAR SPINE - 2-3 VIEW  COMPARISON:  07/15/2012  FINDINGS: There is slight narrowing of the L5-S1 disc space. The rest of the lumbar spine appears normal. Alignment is normal. No facet arthritis.  IMPRESSION: Slight narrowing of the L5-S1 disc space.   Electronically Signed   By: Francene Boyers [name]   On: 11/10/2014 09:16   Dg Spine Portable 1 View  11/10/2014   CLINICAL DATA:  Lumbar laminectomy, 2 level  EXAM: PORTABLE SPINE - 1 VIEW  COMPARISON:  Plain film 11/10/2014  FINDINGS: Posterior surgical instruments are in place with a localizing instrument overlying the L5-S1 disc space.  IMPRESSION: Intraoperative localization of L5-S1.   Electronically Signed   By: Charlett Nose [name]   On: 11/10/2014 09:54   Dg Spine Portable 1 View  11/10/2014   CLINICAL DATA:  Lumbar laminectomy  EXAM: PORTABLE SPINE - 1 VIEW  COMPARISON:  11/10/2014  FINDINGS: Posterior surgical instruments are in place,  extending from L4-S1 levels.  IMPRESSION: Intraoperative localization as above.   Electronically Signed   By: Charlett Nose [name]   On: 11/10/2014 08:40   Dg Spine Portable 1 View  11/10/2014   CLINICAL DATA:  Lumbar laminectomy 2 level.  EXAM: PORTABLE SPINE - 1 VIEW  COMPARISON:  11/10/2014  FINDINGS: Posterior surgical instruments are directed at the L4-5 disc space along the L4 spinous process and at the S1 vertebral body.  IMPRESSION: Intraoperative localization as above.   Electronically Signed   By: Charlett Nose [name]   On: 11/10/2014 08:22    EKG:No orders found for this or any previous visit.   Hospital Course: Patient was admitted to [facility] and taken to the OR and underwent the above state procedure without complications.  Patient tolerated the procedure well and was later transferred to the recovery room and then to the orthopaedic floor for postoperative care.  They were given PO and IV analgesics for pain control following their surgery.  They were given 24 hours of postoperative antibiotics.   PT was consulted postop to assist with mobility and transfers.  The patient was allowed to be WBAT with therapy and was taught back precautions. Discharge planning was consulted to help with postop disposition and equipment needs.  Patient had a good night on the evening of surgery and started to get up OOB with therapy on day  one. Patient was seen in rounds and was ready to go home on day one.  They were given discharge instructions and dressing directions.  They were instructed on when to follow up in the office with Dr. Shelle IronBeane.   Diet: Regular diet Activity:WBAT; Lspine precautions Follow-up:in 10-14 days Disposition - Home Discharged Condition: good   Discharge Instructions    Call MD / Call 911    Complete by:  As directed   If you experience chest pain or shortness of breath, CALL 911 and be transported to the hospital emergency room.  If you develope a fever above 101 F, pus  (white drainage) or increased drainage or redness at the wound, or calf pain, call your surgeon's office.     Constipation Prevention    Complete by:  As directed   Drink plenty of fluids.  Prune juice may be helpful.  You may use a stool softener, such as Colace (over the counter) 100 mg twice a day.  Use MiraLax (over the counter) for constipation as needed.     Diet - low sodium heart healthy    Complete by:  As directed      Driving restrictions    Complete by:  As directed   No driving until cleared by Dr. Shelle IronBeane     Increase activity slowly as tolerated    Complete by:  As directed      Lifting restrictions    Complete by:  As directed   No lifting for 6 weeks            Medication List    STOP taking these medications        HYDROcodone-acetaminophen 5-325 MG per tablet  Commonly known as:  NORCO/VICODIN     ibuprofen 200 MG tablet  Commonly known as:  ADVIL,MOTRIN     metaxalone 800 MG tablet  Commonly known as:  SKELAXIN      TAKE these medications        docusate sodium 100 MG capsule  Commonly known as:  COLACE  Take 1 capsule (100 mg total) by mouth 2 (two) times daily as needed for mild constipation.     methocarbamol 500 MG tablet  Commonly known as:  ROBAXIN  Take 1 tablet (500 mg total) by mouth every 8 (eight) hours as needed for muscle spasms.     oxyCODONE-acetaminophen 5-325 MG per tablet  Commonly known as:  PERCOCET  Take 1 tablet by mouth every 4 (four) hours as needed.           Follow-up Information    Follow up with BEANE,JEFFREY C, MD In 2 weeks.   Specialty:  Orthopedic Surgery   Contact information:   [address] [place] [address] [phone]       Signed: Andrez GrimeJaclyn Arsalan Brisbin, PA-C Orthopaedic Surgery 11/12/2014, 9:11 AM

## 2014-11-13 ENCOUNTER — Encounter (HOSPITAL_COMMUNITY): Payer: Self-pay | Admitting: Specialist

## 2017-04-26 ENCOUNTER — Ambulatory Visit: Payer: Self-pay | Admitting: Orthopedic Surgery

## 2017-04-26 NOTE — Progress Notes (Signed)
Need orders in epic asap for 12-20- sur gery

## 2017-04-26 NOTE — Patient Instructions (Signed)
Jeremy AlstromZachary Ball  04/26/2017   Your procedure is scheduled on: Thursday, Dec. 20, 2018   [facility]   [place]  elevators to 3rd floor to  Short Stay Center at 10:15 AM.   Call this number if you have problems the morning of surgery [phone]   Remember: ONLY 1 PERSON MAY GO WITH YOU TO SHORT STAY TO GET  READY MORNING OF YOUR SURGERY.   Do not eat food or drink liquids :After Midnight.   Take these medicines the morning of surgery with A SIP OF WATER: Gabapentin              You may not have any metal on your body including jewelry and body piercings               Do not wear lotions, powders, perfumes, or deodorant                          Men may shave face and neck.   Do not bring valuables to the hospital. [place] IS NOT             RESPONSIBLE   FOR VALUABLES.   Contacts, dentures or bridgework may not be worn into surgery.   Leave suitcase in the car. After surgery it may be brought to your room.              Please read over the following fact sheets you were given: _____________________________________________________________________       [facility] - Preparing [facility] Before surgery, you can play an important role.  Because skin is not sterile, your skin needs to be as free of germs as possible.  You can reduce the number of germs on your skin by washing with CHG (chlorahexidine gluconate) soap before surgery.  CHG is an antiseptic cleaner which kills germs and bonds with the skin to continue killing germs even after washing. Please DO NOT use if you have an allergy to CHG or antibacterial soaps.  If your skin becomes reddened/irritated stop using the CHG and inform your nurse when you arrive at Short Stay. Do not shave (including legs and underarms) for at least 48 hours prior to the first CHG shower.  You may shave your face/neck.  Please follow these instructions carefully:  1.  Shower with CHG Soap the  night before surgery and the  morning of surgery.  2.  If you choose to wash your hair, wash your hair first as usual with your normal  shampoo.  3.  After you shampoo, rinse your hair and body thoroughly to remove the shampoo.                             4.  Use CHG as you would any other liquid soap.  You can apply chg directly to the skin and wash.  Gently with a scrungie or clean washcloth.  5.  Apply the CHG Soap to your body ONLY FROM THE NECK DOWN.   Do   not use on face/ open                           Wound or open sores. Avoid contact with eyes, ears mouth and   genitals (private  parts).                       Wash face,  Genitals (private parts) with your normal soap.             6.  Wash thoroughly, paying special attention to the area where your    surgery  will be performed.  7.  Thoroughly rinse your body with warm water from the neck down.  8.  DO NOT shower/wash with your normal soap after using and rinsing off the CHG Soap.                9.  Pat yourself dry with a clean towel.            10.  Wear clean pajamas.            11.  Place clean sheets on your bed the night of your first shower and do not  sleep with pets. Day of Surgery : Do not apply any lotions/deodorants the morning of surgery.  Please wear clean clothes to the hospital/surgery center.  FAILURE TO FOLLOW THESE INSTRUCTIONS MAY RESULT IN THE CANCELLATION OF YOUR SURGERY  PATIENT SIGNATURE_________________________________  NURSE SIGNATURE__________________________________  ________________________________________________________________________   Jeremy Ball  An incentive spirometer is a tool that can help keep your lungs clear and active. This tool measures how well you are filling your lungs with each breath. Taking long deep breaths may help reverse or decrease the chance of developing breathing (pulmonary) problems (especially infection) following:  A long period of time when you are unable to  move or be active. BEFORE THE PROCEDURE   If the spirometer includes an indicator to show your best effort, your nurse or respiratory therapist will set it to a desired goal.  If possible, sit up straight or lean slightly forward. Try not to slouch.  Hold the incentive spirometer in an upright position. INSTRUCTIONS FOR USE  1. Sit on the edge of your bed if possible, or sit up as far as you can in bed or on a chair. 2. Hold the incentive spirometer in an upright position. 3. Breathe out normally. 4. Place the mouthpiece in your mouth and seal your lips tightly around it. 5. Breathe in slowly and as deeply as possible, raising the piston or the ball toward the top of the column. 6. Hold your breath for 3-5 seconds or for as long as possible. Allow the piston or ball to fall to the bottom of the column. 7. Remove the mouthpiece from your mouth and breathe out normally. 8. Rest for a few seconds and repeat Steps 1 through 7 at least 10 times every 1-2 hours when you are awake. Take your time and take a few normal breaths between deep breaths. 9. The spirometer may include an indicator to show your best effort. Use the indicator as a goal to work toward during each repetition. 10. After each set of 10 deep breaths, practice coughing to be sure your lungs are clear. If you have an incision (the cut made at the time of surgery), support your incision when coughing by placing a pillow or rolled up towels firmly against it. Once you are able to get out of bed, walk around indoors and cough well. You may stop using the incentive spirometer when instructed by your caregiver.  RISKS AND COMPLICATIONS  Take your time so you do not get dizzy or light-headed.  If you are in pain, you  may need to take or ask for pain medication before doing incentive spirometry. It is harder to take a deep breath if you are having pain. AFTER USE  Rest and breathe slowly and easily.  It can be helpful to keep track of  a log of your progress. Your caregiver can provide you with a simple table to help with this. If you are using the spirometer at home, follow these instructions: SEEK MEDICAL CARE IF:   You are having difficultly using the spirometer.  You have trouble using the spirometer as often as instructed.  Your pain medication is not giving enough relief while using the spirometer.  You develop fever of 100.5 F (38.1 C) or higher. SEEK IMMEDIATE MEDICAL CARE IF:   You cough up bloody sputum that had not been present before.  You develop fever of 102 F (38.9 C) or greater.  You develop worsening pain at or near the incision site. MAKE SURE YOU:   Understand these instructions.  Will watch your condition.  Will get help right away if you are not doing well or get worse. Document Released: 09/07/2006 Document Revised: 07/20/2011 Document Reviewed: 11/08/2006 [facility] Patient Information 2014 [employer] [place].   ________________________________________________________________________

## 2017-04-26 NOTE — H&P (Signed)
Jeremy Ball is an 31 y.o. male.   Chief Complaint: back and leg pain HPI: The patient is a 31 year old male who presents today for follow up of their back. The patient is being followed for their low back symptoms. They are now 4 month(s) out from a flare up. The patient is 2 1/2 years out from lumbar decompression. Symptoms reported today include: pain, leg pain (LLE) and pain with standing (walking). The patient states that they are doing poorly (pain has gotten worse over the past couple of weeks). Current treatment includes: home exercise program, relative rest, activity modification and NSAIDs. The following medication has been used for pain control: ibuprofen. The patient reports their current pain level to be 6 / 10.  Note:Patient reports he is doing squats with weight does not seem to hurt when he forward bends he has pain into his low back and mainly into his buttock and down into his leg. 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 When he stands long. At time it aggravates it when he sits it aggravates it. Getting in and out of a car will aggravate it. Benchpress if he is lying flat.  Refractory to recent dosepak, narcotic medications, NSAIDs, activity modification, relative rest  No past medical history on file.  Past Surgical History:  Procedure Laterality Date  . KNEE SURGERY    . Left Knee Arthroscopy  13 years ago  . LUMBAR LAMINECTOMY/DECOMPRESSION MICRODISCECTOMY N/A 11/10/2014   Procedure: LUMBAR LAMINECTOMY/DECOMPRESSION MICRODISCECTOMY 2 LEVELS;  Surgeon: Jeffrey Beane, MD;  Location: [facility] ORS;  Service: Orthopedics;  Laterality: N/A;    No family history on file. Social History:  reports that  has never smoked. he has never used smokeless tobacco. He reports that he does not drink alcohol or use drugs.  Allergies: No Known Allergies   (Not in a hospital admission)  No results found for this or any previous visit (from the past 48  hour(s)). No results found.  Review of Systems  Constitutional: Negative.   HENT: Negative.   Eyes: Negative.   Respiratory: Negative.   Cardiovascular: Negative.   Gastrointestinal: Negative.   Genitourinary: Negative.   Musculoskeletal: Positive for joint pain.  Skin: Negative.   Neurological: Positive for sensory change and focal weakness.    There were no vitals taken for this visit. Physical Exam  Constitutional: He is oriented to person, place, and time. He appears well-developed and well-nourished. He appears distressed.  HENT:  Head: Normocephalic.  Eyes: Pupils are equal, round, and reactive to light.  Neck: Normal range of motion.  Cardiovascular: Normal rate.  Respiratory: Effort normal.  GI: Soft.  Musculoskeletal:  Healthy. Elevated BMI. Mild distress. Straight leg raise buttock and thigh pain left negative on the right. His pain with forward flexion and extension lumbar spine but limited. No instability hips knees and ankles. Pulses intact. Hyporeflexic. No atrophy.  Neurological: He is alert and oriented to person, place, and time.     3 view x-rays lateral flexion extension lumbar spine demonstrates moderately severe disc degeneration L5-S1. No instability in flexion extension.  MRI with large recurrent HNP L5-S1 central and to left likely impinging the traversing S1 root. Granulation tissue present with associated cyst formation (5mm estimated size). Mild bulge L4-5.  Assessment/Plan Recurrent HNP L5-S1  Plan revision microlumbar decompression L5-S1  Dr Beane has discussed risks, complications and alternatives with the pt. Discussed post-op protocols. Pt desires to proceed, all questions answered.  BISSELL, JACLYN M., PA-C   for Dr. Shelle IronBeane 04/26/2017, 8:30 AM

## 2017-04-26 NOTE — H&P (View-Only) (Signed)
Jeremy AlstromZachary Ball is an 31 y.o. male.   Chief Complaint: back and leg pain HPI: The patient is a 31 year old male who presents today for follow up of their back. The patient is being followed for their low back symptoms. They are now 4 month(s) out from a flare up. The patient is 2 1/2 years out from lumbar decompression. Symptoms reported today include: pain, leg pain (LLE) and pain with standing (walking). The patient states that they are doing poorly (pain has gotten worse over the past couple of weeks). Current treatment includes: home exercise program, relative rest, activity modification and NSAIDs. The following medication has been used for pain control: ibuprofen. The patient reports their current pain level to be 6 / 10.  Note:Patient reports he is doing squats with weight does not seem to hurt when he forward bends he has pain into his low back and mainly into his buttock and down into his leg. 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 When he stands long. At time it aggravates it when he sits it aggravates it. Getting in and out of a car will aggravate it. Benchpress if he is lying flat.  Refractory to recent dosepak, narcotic medications, NSAIDs, activity modification, relative rest  No past medical history on file.  Past Surgical History:  Procedure Laterality Date  . KNEE SURGERY    . Left Knee Arthroscopy  13 years ago  . LUMBAR LAMINECTOMY/DECOMPRESSION MICRODISCECTOMY N/A 11/10/2014   Procedure: LUMBAR LAMINECTOMY/DECOMPRESSION MICRODISCECTOMY 2 LEVELS;  Surgeon: Jene EveryJeffrey Beane, MD;  Location: [facility];  Service: Orthopedics;  Laterality: N/A;    No family history on file. Social History:  reports that  has never smoked. he has never used smokeless tobacco. He reports that he does not drink alcohol or use drugs.  Allergies: No Known Allergies   (Not in a hospital admission)  No results found for this or any previous visit (from the past 48  hour(s)). No results found.  Review of Systems  Constitutional: Negative.   HENT: Negative.   Eyes: Negative.   Respiratory: Negative.   Cardiovascular: Negative.   Gastrointestinal: Negative.   Genitourinary: Negative.   Musculoskeletal: Positive for joint pain.  Skin: Negative.   Neurological: Positive for sensory change and focal weakness.    There were no vitals taken for this visit. Physical Exam  Constitutional: He is oriented to person, place, and time. He appears well-developed and well-nourished. He appears distressed.  HENT:  Head: Normocephalic.  Eyes: Pupils are equal, round, and reactive to light.  Neck: Normal range of motion.  Cardiovascular: Normal rate.  Respiratory: Effort normal.  GI: Soft.  Musculoskeletal:  Healthy. Elevated BMI. Mild distress. Straight leg raise buttock and thigh pain left negative on the right. His pain with forward flexion and extension lumbar spine but limited. No instability hips knees and ankles. Pulses intact. Hyporeflexic. No atrophy.  Neurological: He is alert and oriented to person, place, and time.     3 view x-rays lateral flexion extension lumbar spine demonstrates moderately severe disc degeneration L5-S1. No instability in flexion extension.  MRI with large recurrent HNP L5-S1 central and to left likely impinging the traversing S1 root. Granulation tissue present with associated cyst formation (5mm estimated size). Mild bulge L4-5.  Assessment/Plan Recurrent HNP L5-S1  Plan revision microlumbar decompression L5-S1  Dr Shelle IronBeane has discussed risks, complications and alternatives with the pt. Discussed post-op protocols. Pt desires to proceed, all questions answered.  Dorothy SparkBISSELL,  M., PA-C  for Dr. Shelle IronBeane 04/26/2017, 8:30 AM

## 2017-04-28 ENCOUNTER — Encounter (INDEPENDENT_AMBULATORY_CARE_PROVIDER_SITE_OTHER): Payer: Self-pay

## 2017-04-28 ENCOUNTER — Other Ambulatory Visit: Payer: Self-pay

## 2017-04-28 ENCOUNTER — Encounter (HOSPITAL_COMMUNITY): Payer: Self-pay

## 2017-04-28 ENCOUNTER — Encounter (HOSPITAL_COMMUNITY)
Admission: RE | Admit: 2017-04-28 | Discharge: 2017-04-28 | Disposition: A | Discharge status: Home / Self Care | Payer: BC Managed Care – PPO | Source: Ambulatory Visit | Attending: Specialist | Admitting: Specialist

## 2017-04-28 ENCOUNTER — Ambulatory Visit (HOSPITAL_COMMUNITY)
Admission: RE | Admit: 2017-04-28 | Discharge: 2017-04-28 | Disposition: A | Discharge status: Home / Self Care | Payer: BC Managed Care – PPO | Source: Ambulatory Visit | Attending: Orthopedic Surgery | Admitting: Orthopedic Surgery

## 2017-04-28 DIAGNOSIS — M5146 Schmorl's nodes, lumbar region: Secondary | ICD-10-CM | POA: Insufficient documentation

## 2017-04-28 DIAGNOSIS — Z01812 Encounter for preprocedural laboratory examination: Secondary | ICD-10-CM | POA: Insufficient documentation

## 2017-04-28 DIAGNOSIS — Z01818 Encounter for other preprocedural examination: Secondary | ICD-10-CM | POA: Insufficient documentation

## 2017-04-28 DIAGNOSIS — M5126 Other intervertebral disc displacement, lumbar region: Secondary | ICD-10-CM | POA: Diagnosis not present

## 2017-04-28 HISTORY — DX: Headache: R51

## 2017-04-28 HISTORY — DX: Nausea with vomiting, unspecified: R11.2

## 2017-04-28 HISTORY — DX: Other specified postprocedural states: Z98.890

## 2017-04-28 HISTORY — DX: Sciatica, unspecified side: M54.30

## 2017-04-28 HISTORY — DX: Headache, unspecified: R51.9

## 2017-04-28 LAB — CBC
HEMATOCRIT: 49.8 % (ref 39.0–52.0)
HEMOGLOBIN: 16.5 g/dL (ref 13.0–17.0)
MCH: 29.3 pg (ref 26.0–34.0)
MCHC: 33.1 g/dL (ref 30.0–36.0)
MCV: 88.3 fL (ref 78.0–100.0)
PLATELETS: 262 10*3/uL (ref 150–400)
RBC: 5.64 MIL/uL (ref 4.22–5.81)
RDW: 12.8 % (ref 11.5–15.5)
WBC: 8.8 10*3/uL (ref 4.0–10.5)

## 2017-04-28 LAB — BASIC METABOLIC PANEL
Anion gap: 6 (ref 5–15)
BUN: 18 mg/dL (ref 6–20)
CHLORIDE: 101 mmol/L (ref 101–111)
CO2: 30 mmol/L (ref 22–32)
CREATININE: 1.14 mg/dL (ref 0.61–1.24)
Calcium: 9.5 mg/dL (ref 8.9–10.3)
GFR calc non Af Amer: >60 mL/min (ref >60)
Glucose, Bld: 92 mg/dL (ref 65–99)
Potassium: 4.4 mmol/L (ref 3.5–5.1)
Sodium: 137 mmol/L (ref 135–145)

## 2017-04-28 LAB — SURGICAL PCR SCREEN
MRSA, PCR: NEGATIVE
Staphylococcus aureus: NEGATIVE

## 2017-04-28 MED ORDER — DEXTROSE 5 % IV SOLN
3.0000 g | INTRAVENOUS | Status: AC
  Administered 2017-04-29: 3 g via INTRAVENOUS
  Filled 2017-04-28: qty 3

## 2017-04-29 ENCOUNTER — Encounter (HOSPITAL_COMMUNITY)
Admission: RE | Disposition: A | Discharge status: Home / Self Care | Payer: Self-pay | Source: Ambulatory Visit | Attending: Specialist

## 2017-04-29 ENCOUNTER — Ambulatory Visit (HOSPITAL_COMMUNITY): Payer: BC Managed Care – PPO

## 2017-04-29 ENCOUNTER — Encounter (HOSPITAL_COMMUNITY): Payer: Self-pay

## 2017-04-29 ENCOUNTER — Ambulatory Visit (HOSPITAL_COMMUNITY): Payer: BC Managed Care – PPO | Admitting: Anesthesiology

## 2017-04-29 ENCOUNTER — Ambulatory Visit (HOSPITAL_COMMUNITY)
Admission: RE | Admit: 2017-04-29 | Discharge: 2017-04-30 | Disposition: A | Discharge status: Home / Self Care | Payer: BC Managed Care – PPO | Source: Ambulatory Visit | Attending: Specialist | Admitting: Specialist

## 2017-04-29 DIAGNOSIS — M5137 Other intervertebral disc degeneration, lumbosacral region: Secondary | ICD-10-CM | POA: Insufficient documentation

## 2017-04-29 DIAGNOSIS — M48061 Spinal stenosis, lumbar region without neurogenic claudication: Secondary | ICD-10-CM | POA: Insufficient documentation

## 2017-04-29 DIAGNOSIS — M5127 Other intervertebral disc displacement, lumbosacral region: Secondary | ICD-10-CM | POA: Diagnosis not present

## 2017-04-29 DIAGNOSIS — M5126 Other intervertebral disc displacement, lumbar region: Secondary | ICD-10-CM | POA: Diagnosis not present

## 2017-04-29 DIAGNOSIS — Z79899 Other long term (current) drug therapy: Secondary | ICD-10-CM | POA: Diagnosis not present

## 2017-04-29 DIAGNOSIS — Z419 Encounter for procedure for purposes other than remedying health state, unspecified: Secondary | ICD-10-CM

## 2017-04-29 DIAGNOSIS — M4807 Spinal stenosis, lumbosacral region: Secondary | ICD-10-CM | POA: Diagnosis not present

## 2017-04-29 HISTORY — PX: LUMBAR LAMINECTOMY/DECOMPRESSION MICRODISCECTOMY: SHX5026

## 2017-04-29 SURGERY — LUMBAR LAMINECTOMY/DECOMPRESSION MICRODISCECTOMY 1 LEVEL
Anesthesia: General | Site: Back

## 2017-04-29 MED ORDER — LORAZEPAM 2 MG/ML IJ SOLN
1.0000 mg | Freq: Once | INTRAMUSCULAR | Status: AC
  Administered 2017-04-29: 1 mg via INTRAVENOUS
  Filled 2017-04-29: qty 0.5

## 2017-04-29 MED ORDER — FENTANYL CITRATE (PF) 250 MCG/5ML IJ SOLN
INTRAMUSCULAR | Status: AC
  Filled 2017-04-29: qty 5

## 2017-04-29 MED ORDER — ONDANSETRON HCL 4 MG/2ML IJ SOLN
INTRAMUSCULAR | Status: AC
  Filled 2017-04-29: qty 2

## 2017-04-29 MED ORDER — PROPOFOL 10 MG/ML IV BOLUS
INTRAVENOUS | Status: AC
  Filled 2017-04-29: qty 20

## 2017-04-29 MED ORDER — ACETAMINOPHEN 10 MG/ML IV SOLN
1000.0000 mg | Freq: Four times a day (QID) | INTRAVENOUS | Status: DC
  Administered 2017-04-29: 1000 mg via INTRAVENOUS

## 2017-04-29 MED ORDER — ONDANSETRON HCL 4 MG/2ML IJ SOLN
INTRAMUSCULAR | Status: DC | PRN
  Administered 2017-04-29: 4 mg via INTRAVENOUS

## 2017-04-29 MED ORDER — SUGAMMADEX SODIUM 500 MG/5ML IV SOLN
INTRAVENOUS | Status: AC
  Filled 2017-04-29: qty 5

## 2017-04-29 MED ORDER — BISACODYL 5 MG PO TBEC: 5.0000 mg | DELAYED_RELEASE_TABLET | Freq: Every day | ORAL | Status: DC | PRN

## 2017-04-29 MED ORDER — ONDANSETRON HCL 4 MG PO TABS: 4.0000 mg | ORAL_TABLET | Freq: Four times a day (QID) | ORAL | Status: DC | PRN

## 2017-04-29 MED ORDER — RISAQUAD PO CAPS
1.0000 | ORAL_CAPSULE | Freq: Every day | ORAL | Status: DC
  Administered 2017-04-30: 10:00:00 1 via ORAL
  Filled 2017-04-29: qty 1

## 2017-04-29 MED ORDER — CEFAZOLIN SODIUM-DEXTROSE 2-4 GM/100ML-% IV SOLN
2.0000 g | Freq: Three times a day (TID) | INTRAVENOUS | Status: AC
  Administered 2017-04-29 – 2017-04-30 (×3): 2 g via INTRAVENOUS
  Filled 2017-04-29 (×3): qty 100

## 2017-04-29 MED ORDER — SODIUM CHLORIDE 0.9 % IV SOLN
INTRAVENOUS | Status: AC
  Filled 2017-04-29: qty 500000

## 2017-04-29 MED ORDER — BUPIVACAINE-EPINEPHRINE (PF) 0.25% -1:200000 IJ SOLN
INTRAMUSCULAR | Status: AC
  Filled 2017-04-29: qty 30

## 2017-04-29 MED ORDER — MEPERIDINE HCL 50 MG/ML IJ SOLN
6.2500 mg | INTRAMUSCULAR | Status: DC | PRN
  Administered 2017-04-29 (×2): 12.5 mg via INTRAVENOUS

## 2017-04-29 MED ORDER — DEXAMETHASONE SODIUM PHOSPHATE 10 MG/ML IJ SOLN
INTRAMUSCULAR | Status: AC
  Filled 2017-04-29: qty 1

## 2017-04-29 MED ORDER — MENTHOL 3 MG MT LOZG: 1.0000 | LOZENGE | OROMUCOSAL | Status: DC | PRN

## 2017-04-29 MED ORDER — ACETAMINOPHEN 650 MG RE SUPP: 650.0000 mg | RECTAL | Status: DC | PRN

## 2017-04-29 MED ORDER — PROPOFOL 10 MG/ML IV BOLUS
INTRAVENOUS | Status: DC | PRN
  Administered 2017-04-29: 250 mg via INTRAVENOUS

## 2017-04-29 MED ORDER — HYDROMORPHONE HCL 1 MG/ML IJ SOLN
1.0000 mg | INTRAMUSCULAR | Status: DC | PRN
  Administered 2017-04-30: 06:00:00 1 mg via INTRAVENOUS
  Filled 2017-04-29 (×2): qty 1

## 2017-04-29 MED ORDER — MIDAZOLAM HCL 2 MG/2ML IJ SOLN
INTRAMUSCULAR | Status: AC
  Filled 2017-04-29: qty 2

## 2017-04-29 MED ORDER — LIDOCAINE 2% (20 MG/ML) 5 ML SYRINGE
INTRAMUSCULAR | Status: AC
  Filled 2017-04-29: qty 5

## 2017-04-29 MED ORDER — THROMBIN (RECOMBINANT) 5000 UNITS EX SOLR
CUTANEOUS | Status: AC
  Filled 2017-04-29: qty 10000

## 2017-04-29 MED ORDER — ONDANSETRON HCL 4 MG/2ML IJ SOLN: 4.0000 mg | Freq: Four times a day (QID) | INTRAMUSCULAR | Status: DC | PRN

## 2017-04-29 MED ORDER — MEPERIDINE HCL 50 MG/ML IJ SOLN
INTRAMUSCULAR | Status: AC
  Filled 2017-04-29: qty 1

## 2017-04-29 MED ORDER — LIDOCAINE HCL (CARDIAC) 20 MG/ML IV SOLN
INTRAVENOUS | Status: DC | PRN
  Administered 2017-04-29: 50 mg via INTRAVENOUS

## 2017-04-29 MED ORDER — HYDROMORPHONE HCL 1 MG/ML IJ SOLN
0.2500 mg | INTRAMUSCULAR | Status: AC | PRN
  Administered 2017-04-29 (×8): 0.5 mg via INTRAVENOUS

## 2017-04-29 MED ORDER — HYDROMORPHONE HCL 1 MG/ML IJ SOLN
INTRAMUSCULAR | Status: AC
  Filled 2017-04-29: qty 2

## 2017-04-29 MED ORDER — GABAPENTIN 300 MG PO CAPS
600.0000 mg | ORAL_CAPSULE | Freq: Three times a day (TID) | ORAL | Status: DC
  Administered 2017-04-29: 22:00:00 600 mg via ORAL
  Filled 2017-04-29: qty 2

## 2017-04-29 MED ORDER — SODIUM CHLORIDE 0.9 % IV SOLN
INTRAVENOUS | Status: DC | PRN
  Administered 2017-04-29: 500 mL

## 2017-04-29 MED ORDER — BUPIVACAINE-EPINEPHRINE (PF) 0.5% -1:200000 IJ SOLN
INTRAMUSCULAR | Status: AC
  Filled 2017-04-29: qty 30

## 2017-04-29 MED ORDER — OXYCODONE HCL 5 MG PO TABS: 5.0000 mg | ORAL_TABLET | ORAL | Status: DC | PRN

## 2017-04-29 MED ORDER — ROCURONIUM BROMIDE 100 MG/10ML IV SOLN
INTRAVENOUS | Status: DC | PRN
  Administered 2017-04-29: 50 mg via INTRAVENOUS
  Administered 2017-04-29 (×5): 10 mg via INTRAVENOUS

## 2017-04-29 MED ORDER — GABAPENTIN 300 MG PO CAPS: 600.0000 mg | ORAL_CAPSULE | Freq: Two times a day (BID) | ORAL | Status: DC

## 2017-04-29 MED ORDER — METHOCARBAMOL 500 MG PO TABS
500.0000 mg | ORAL_TABLET | Freq: Four times a day (QID) | ORAL | Status: DC | PRN
  Administered 2017-04-30 (×3): 500 mg via ORAL
  Filled 2017-04-29 (×4): qty 1

## 2017-04-29 MED ORDER — BUPIVACAINE-EPINEPHRINE 0.5% -1:200000 IJ SOLN
INTRAMUSCULAR | Status: DC | PRN
  Administered 2017-04-29: 10 mL

## 2017-04-29 MED ORDER — DOCUSATE SODIUM 100 MG PO CAPS
100.0000 mg | ORAL_CAPSULE | Freq: Two times a day (BID) | ORAL | Status: DC
  Administered 2017-04-29 – 2017-04-30 (×2): 100 mg via ORAL
  Filled 2017-04-29 (×2): qty 1

## 2017-04-29 MED ORDER — ACETAMINOPHEN 325 MG PO TABS: 650.0000 mg | ORAL_TABLET | ORAL | Status: DC | PRN

## 2017-04-29 MED ORDER — POLYETHYLENE GLYCOL 3350 17 G PO PACK: 17.0000 g | PACK | Freq: Every day | ORAL | Status: DC | PRN

## 2017-04-29 MED ORDER — ROCURONIUM BROMIDE 50 MG/5ML IV SOSY
PREFILLED_SYRINGE | INTRAVENOUS | Status: AC
  Filled 2017-04-29: qty 5

## 2017-04-29 MED ORDER — ACETAMINOPHEN 10 MG/ML IV SOLN
1000.0000 mg | INTRAVENOUS | Status: AC
  Administered 2017-04-29: 1000 mg via INTRAVENOUS
  Filled 2017-04-29: qty 100

## 2017-04-29 MED ORDER — FENTANYL CITRATE (PF) 100 MCG/2ML IJ SOLN
INTRAMUSCULAR | Status: DC | PRN
  Administered 2017-04-29 (×2): 50 ug via INTRAVENOUS
  Administered 2017-04-29: 100 ug via INTRAVENOUS
  Administered 2017-04-29: 50 ug via INTRAVENOUS

## 2017-04-29 MED ORDER — KCL IN DEXTROSE-NACL 20-5-0.45 MEQ/L-%-% IV SOLN
INTRAVENOUS | Status: DC
  Administered 2017-04-29: 22:00:00 via INTRAVENOUS
  Filled 2017-04-29 (×2): qty 1000

## 2017-04-29 MED ORDER — MIDAZOLAM HCL 5 MG/5ML IJ SOLN
INTRAMUSCULAR | Status: DC | PRN
  Administered 2017-04-29: 2 mg via INTRAVENOUS

## 2017-04-29 MED ORDER — DEXAMETHASONE SODIUM PHOSPHATE 10 MG/ML IJ SOLN
INTRAMUSCULAR | Status: DC | PRN
  Administered 2017-04-29: 10 mg via INTRAVENOUS

## 2017-04-29 MED ORDER — GABAPENTIN 300 MG PO CAPS: 300.0000 mg | ORAL_CAPSULE | Freq: Three times a day (TID) | ORAL | Status: DC

## 2017-04-29 MED ORDER — SUGAMMADEX SODIUM 500 MG/5ML IV SOLN
INTRAVENOUS | Status: DC | PRN
  Administered 2017-04-29: 300 mg via INTRAVENOUS

## 2017-04-29 MED ORDER — METHOCARBAMOL 1000 MG/10ML IJ SOLN
500.0000 mg | Freq: Four times a day (QID) | INTRAVENOUS | Status: DC | PRN
  Administered 2017-04-29: 500 mg via INTRAVENOUS
  Filled 2017-04-29: qty 550

## 2017-04-29 MED ORDER — ALUM & MAG HYDROXIDE-SIMETH 200-200-20 MG/5ML PO SUSP: 30.0000 mL | Freq: Four times a day (QID) | ORAL | Status: DC | PRN

## 2017-04-29 MED ORDER — OXYCODONE HCL 5 MG PO TABS
10.0000 mg | ORAL_TABLET | ORAL | Status: DC | PRN
Start: 2017-04-29 — End: 2017-04-30
  Administered 2017-04-29 – 2017-04-30 (×6): 10 mg via ORAL
  Filled 2017-04-29 (×6): qty 2

## 2017-04-29 MED ORDER — PHENOL 1.4 % MT LIQD
1.0000 | OROMUCOSAL | Status: DC | PRN
Start: 2017-04-29 — End: 2017-04-30

## 2017-04-29 MED ORDER — MAGNESIUM CITRATE PO SOLN: 1.0000 | Freq: Once | ORAL | Status: DC | PRN

## 2017-04-29 MED ORDER — LACTATED RINGERS IV SOLN
INTRAVENOUS | Status: DC
  Administered 2017-04-29: 1000 mL via INTRAVENOUS
  Administered 2017-04-29: 15:00:00 via INTRAVENOUS

## 2017-04-29 SURGICAL SUPPLY — 48 items
BAG ZIPLOCK 12X15 (MISCELLANEOUS) IMPLANT
CLEANER TIP ELECTROSURG 2X2 (MISCELLANEOUS) ×3 IMPLANT
CLOSURE WOUND 1/2 X4 (GAUZE/BANDAGES/DRESSINGS) ×1
CLOTH 2% CHLOROHEXIDINE 3PK (PERSONAL CARE ITEMS) ×3 IMPLANT
COVER SURGICAL LIGHT HANDLE (MISCELLANEOUS) ×3 IMPLANT
DRAPE MICROSCOPE LEICA (MISCELLANEOUS) ×3 IMPLANT
DRAPE POUCH INSTRU U-SHP 10X18 (DRAPES) ×3 IMPLANT
DRAPE SHEET LG 3/4 BI-LAMINATE (DRAPES) ×3 IMPLANT
DRAPE SURG 17X11 SM STRL (DRAPES) ×3 IMPLANT
DRAPE UTILITY XL STRL (DRAPES) ×3 IMPLANT
DRSG AQUACEL AG ADV 3.5X 4 (GAUZE/BANDAGES/DRESSINGS) IMPLANT
DRSG AQUACEL AG ADV 3.5X 6 (GAUZE/BANDAGES/DRESSINGS) ×3 IMPLANT
DURAPREP 26ML APPLICATOR (WOUND CARE) ×3 IMPLANT
DURASEAL SPINE SEALANT 3ML (MISCELLANEOUS) IMPLANT
ELECT BLADE TIP CTD 4 INCH (ELECTRODE) IMPLANT
ELECT REM PT RETURN 15FT ADLT (MISCELLANEOUS) ×3 IMPLANT
GLOVE BIOGEL PI IND STRL 7.0 (GLOVE) ×1 IMPLANT
GLOVE BIOGEL PI INDICATOR 7.0 (GLOVE) ×2
GLOVE SURG SS PI 7.0 STRL IVOR (GLOVE) ×3 IMPLANT
GLOVE SURG SS PI 7.5 STRL IVOR (GLOVE) ×18 IMPLANT
GLOVE SURG SS PI 8.0 STRL IVOR (GLOVE) ×6 IMPLANT
GOWN STRL REUS W/TWL XL LVL3 (GOWN DISPOSABLE) ×12 IMPLANT
HEMOSTAT SPONGE AVITENE ULTRA (HEMOSTASIS) IMPLANT
IV CATH 14GX2 1/4 (CATHETERS) ×3 IMPLANT
KIT BASIN OR (CUSTOM PROCEDURE TRAY) ×3 IMPLANT
KIT POSITIONING SURG ANDREWS (MISCELLANEOUS) ×3 IMPLANT
MANIFOLD NEPTUNE II (INSTRUMENTS) ×3 IMPLANT
NEEDLE SPNL 18GX3.5 QUINCKE PK (NEEDLE) ×6 IMPLANT
PACK LAMINECTOMY ORTHO (CUSTOM PROCEDURE TRAY) ×3 IMPLANT
PATTIES SURGICAL .5 X.5 (GAUZE/BANDAGES/DRESSINGS) IMPLANT
PATTIES SURGICAL .75X.75 (GAUZE/BANDAGES/DRESSINGS) IMPLANT
PATTIES SURGICAL 1X1 (DISPOSABLE) IMPLANT
RUBBERBAND STERILE (MISCELLANEOUS) ×6 IMPLANT
SPONGE SURGIFOAM ABS GEL 100 (HEMOSTASIS) ×3 IMPLANT
STAPLER VISISTAT (STAPLE) ×3 IMPLANT
STRIP CLOSURE SKIN 1/2X4 (GAUZE/BANDAGES/DRESSINGS) ×2 IMPLANT
SUT NURALON 4 0 TR CR/8 (SUTURE) IMPLANT
SUT PROLENE 3 0 PS 2 (SUTURE) IMPLANT
SUT VIC AB 1 CT1 27 (SUTURE) ×6
SUT VIC AB 1 CT1 27XBRD ANTBC (SUTURE) ×3 IMPLANT
SUT VIC AB 1-0 CT2 27 (SUTURE) ×3 IMPLANT
SUT VIC AB 2-0 CT1 27 (SUTURE) ×4
SUT VIC AB 2-0 CT1 TAPERPNT 27 (SUTURE) ×2 IMPLANT
SUT VIC AB 2-0 CT2 27 (SUTURE) ×3 IMPLANT
SYR 3ML LL SCALE MARK (SYRINGE) IMPLANT
TOWEL OR 17X26 10 PK STRL BLUE (TOWEL DISPOSABLE) ×3 IMPLANT
TOWEL OR NON WOVEN STRL DISP B (DISPOSABLE) IMPLANT
YANKAUER SUCT BULB TIP NO VENT (SUCTIONS) IMPLANT

## 2017-04-29 NOTE — Interval H&P Note (Signed)
History and Physical Interval Note:  04/29/2017 1:02 PM  Jeremy Ball  has presented today for surgery, with the diagnosis of Recurrent HNP, degenerative disc disease L5-S1  The various methods of treatment have been discussed with the patient and family. After consideration of risks, benefits and other options for treatment, the patient has consented to  Procedure(s) with comments: Revision microlumbar decompression L5-S1 (N/A) - 2hrs as a surgical intervention .  The patient's history has been reviewed, patient examined, no change in status, stable for surgery.  I have reviewed the patient's chart and labs.  Questions were answered to the patient's satisfaction.     Zona Pedro C

## 2017-04-29 NOTE — Anesthesia Procedure Notes (Signed)
Procedure Name: Intubation Date/Time: 04/29/2017 1:22 PM Performed by: Garrel Ridgel, CRNA Pre-anesthesia Checklist: Patient identified, Emergency Drugs available, Suction available, Patient being monitored and Timeout performed Patient Re-evaluated:Patient Re-evaluated prior to induction Oxygen Delivery Method: Circle system utilized Preoxygenation: Pre-oxygenation with 100% oxygen Induction Type: IV induction Ventilation: Mask ventilation without difficulty Laryngoscope Size: Mac and 4 Grade View: Grade I Tube type: Oral Laser Tube: Cuffed inflated with minimal occlusive pressure - saline Tube size: 7.5 mm Number of attempts: 1 Airway Equipment and Method: Stylet Placement Confirmation: ETT inserted through vocal cords under direct vision,  positive ETCO2 and breath sounds checked- equal and bilateral Secured at: 23 cm Tube secured with: Tape

## 2017-04-29 NOTE — Discharge Instructions (Signed)

## 2017-04-29 NOTE — Anesthesia Preprocedure Evaluation (Signed)
Anesthesia Evaluation  Patient identified by MRN, date of birth, ID band Patient awake    Reviewed: Allergy & Precautions, H&P , Patient's Chart, lab work & pertinent test results, reviewed documented beta blocker date and time   History of Anesthesia Complications (+) PONV  Airway Mallampati: II  TM Distance: >3 FB Neck ROM: full    Dental no notable dental hx.    Pulmonary    Pulmonary exam normal breath sounds clear to auscultation       Cardiovascular  Rhythm:regular Rate:Normal     Neuro/Psych    GI/Hepatic   Endo/Other    Renal/GU      Musculoskeletal   Abdominal   Peds  Hematology   Anesthesia Other Findings   Reproductive/Obstetrics                             Anesthesia Physical Anesthesia Plan  ASA: I  Anesthesia Plan: General   Post-op Pain Management:    Induction: Intravenous  PONV Risk Score and Plan: 2 and Ondansetron, Dexamethasone, Scopolamine patch - Pre-op and Treatment may vary due to age or medical condition  Airway Management Planned: Oral ETT  Additional Equipment:   Intra-op Plan:   Post-operative Plan: Extubation in OR  Informed Consent: I have reviewed the patients History and Physical, chart, labs and discussed the procedure including the risks, benefits and alternatives for the proposed anesthesia with the patient or authorized representative who has indicated his/her understanding and acceptance.   Dental Advisory Given  Plan Discussed with: CRNA and Surgeon  Anesthesia Plan Comments: (  )        Anesthesia Quick Evaluation

## 2017-04-29 NOTE — Transfer of Care (Signed)
Immediate Anesthesia Transfer of Care Note  Patient: Jeremy Ball  Procedure(s) Performed: Revision microlumbar decompression L5-S1,DISCECTOMY L5,S1 (N/A Back)  Patient Location: PACU  Anesthesia Type:General  Level of Consciousness: awake, alert  and oriented  Airway & Oxygen Therapy: Patient Spontanous Breathing and Patient connected to face mask oxygen  Post-op Assessment: Report given to RN and Post -op Vital signs reviewed and stable  Post vital signs: Reviewed and stable  Last Vitals:  Vitals:   04/29/17 1033  BP: 138/80  Pulse: 74  Resp: 16  Temp: 36.8 C  SpO2: 96%    Last Pain:  Vitals:   04/29/17 1052  TempSrc:   PainSc: 8       Patients Stated Pain Goal: 5 (04/29/17 1052)  Complications: No apparent anesthesia complications

## 2017-04-30 ENCOUNTER — Encounter (HOSPITAL_COMMUNITY): Payer: Self-pay | Admitting: Specialist

## 2017-04-30 DIAGNOSIS — M4807 Spinal stenosis, lumbosacral region: Secondary | ICD-10-CM | POA: Diagnosis not present

## 2017-04-30 LAB — BASIC METABOLIC PANEL
ANION GAP: 9 (ref 5–15)
BUN: 17 mg/dL (ref 6–20)
CALCIUM: 9.3 mg/dL (ref 8.9–10.3)
CO2: 26 mmol/L (ref 22–32)
Chloride: 100 mmol/L — ABNORMAL LOW (ref 101–111)
Creatinine, Ser: 1.03 mg/dL (ref 0.61–1.24)
GLUCOSE: 144 mg/dL — AB (ref 65–99)
POTASSIUM: 4.4 mmol/L (ref 3.5–5.1)
Sodium: 135 mmol/L (ref 135–145)

## 2017-04-30 MED ORDER — METHOCARBAMOL 500 MG PO TABS: 500.0000 mg | ORAL_TABLET | Freq: Four times a day (QID) | ORAL | 1 refills | Status: AC | PRN

## 2017-04-30 MED ORDER — DOCUSATE SODIUM 100 MG PO CAPS
100.0000 mg | ORAL_CAPSULE | Freq: Two times a day (BID) | ORAL | 1 refills | Status: AC
Start: 2017-04-30 — End: ?

## 2017-04-30 MED ORDER — OXYCODONE HCL 5 MG PO TABS: 5.0000 mg | ORAL_TABLET | ORAL | 0 refills | Status: AC | PRN

## 2017-04-30 MED ORDER — LORAZEPAM 1 MG PO TABS: 1.0000 mg | ORAL_TABLET | Freq: Four times a day (QID) | ORAL | Status: DC | PRN

## 2017-04-30 MED ORDER — POLYETHYLENE GLYCOL 3350 17 G PO PACK: 17.0000 g | PACK | Freq: Every day | ORAL | 0 refills | Status: AC | PRN

## 2017-04-30 MED ORDER — GABAPENTIN 300 MG PO CAPS: 600.0000 mg | ORAL_CAPSULE | Freq: Three times a day (TID) | ORAL | 1 refills | Status: AC

## 2017-04-30 MED ORDER — IBUPROFEN 200 MG PO TABS: 400.0000 mg | ORAL_TABLET | Freq: Three times a day (TID) | ORAL | 0 refills | Status: AC | PRN

## 2017-04-30 MED ORDER — GABAPENTIN 300 MG PO CAPS
900.0000 mg | ORAL_CAPSULE | Freq: Three times a day (TID) | ORAL | Status: DC
  Administered 2017-04-30: 10:00:00 900 mg via ORAL
  Filled 2017-04-30: qty 3

## 2017-04-30 NOTE — Evaluation (Signed)
Occupational Therapy Evaluation Patient Details Name: Jeremy Ball MRN: [id number] DOB: 28-Apr-1986 Today's Date: 04/30/2017    History of Present Illness s/p L5-S1 decompression   Clinical Impression   This 31 year old man was admitted for the above sx.  He has a h/o lumbar decompression 2 1/2 years ago.  Reviewed back precautions and safe methods of performing adls following these precautions.  Pt is tall but feels he will be fine with his standard commode. He does not want DME.    Follow Up Recommendations  No OT follow up    Equipment Recommendations  None recommended by OT(pt does not want DME)    Recommendations for Other Services       Precautions / Restrictions Precautions Precautions: Back Restrictions Weight Bearing Restrictions: No      Mobility Bed Mobility                  Transfers                      Balance                                           ADL either performed or assessed with clinical judgement   ADL Overall ADL's : Needs assistance/impaired                                       General ADL Comments: pt had dressed himself in standing prior to OT's arrival.  Reviewed protocol and demonstrated alternative methods to dressing which he could better follow back precautions. Pt was able to cross leg from bed level and perform LB ADL with set up/very minimal assistance.  Reviewed toilet aide options.  Pt used our commode which is 2 1/2 inches higher than a standard.  He feels like he will be fine with his commode at home. Pt is 6'5".  Also showed sitting backwards and using tank for support if needed     Vision         Perception     Praxis      Pertinent Vitals/Pain Pain Assessment: 0-10 Pain Score: 2  Pain Location: back Pain Descriptors / Indicators: Sore Pain Intervention(s): Limited activity within patient's tolerance;Monitored during session     Hand Dominance      Extremity/Trunk Assessment Upper Extremity Assessment Upper Extremity Assessment: Overall WFL for tasks assessed           Communication Communication Communication: No difficulties   Cognition Arousal/Alertness: Awake/alert Behavior During Therapy: WFL for tasks assessed/performed Overall Cognitive Status: Within Functional Limits for tasks assessed                                     General Comments       Exercises     Shoulder Instructions      Home Living Family/patient expects to be discharged to:: Private residence Living Arrangements: Spouse/significant other;Children                 Bathroom Shower/Tub: Producer, television/film/videoWalk-in shower   Bathroom Toilet: Standard     Home Equipment: None          Prior Functioning/Environment Level of Independence: Independent  OT Problem List:        OT Treatment/Interventions:      OT Goals(Current goals can be found in the care plan section) Acute Rehab OT Goals Patient Stated Goal: return to gym OT Goal Formulation: All assessment and education complete, DC therapy  OT Frequency:     Barriers to D/C:            Co-evaluation              AM-PAC PT "6 Clicks" Daily Activity     Outcome Measure Help from another person eating meals?: None Help from another person taking care of personal grooming?: A Little Help from another person toileting, which includes using toliet, bedpan, or urinal?: A Little Help from another person bathing (including washing, rinsing, drying)?: A Little Help from another person to put on and taking off regular upper body clothing?: A Little Help from another person to put on and taking off regular lower body clothing?: A Little 6 Click Score: 19   End of Session    Activity Tolerance: Patient tolerated treatment well Patient left: in bed;with call bell/phone within reach  OT Visit Diagnosis: Muscle weakness (generalized) (M62.81)                 Time: 6045-40980906-0917 OT Time Calculation (min): 11 min Charges:  OT General Charges $OT Visit: 1 Visit OT Evaluation $OT Eval Low Complexity: 1 Low G-Codes: OT G-codes **NOT FOR INPATIENT CLASS** Functional Assessment Tool Used: Clinical judgement Functional Limitation: Self care Self Care Current Status (J1914(G8987): At least 1 percent but less than 20 percent impaired, limited or restricted Self Care Goal Status ([id number](G8988): At least 1 percent but less than 20 percent impaired, limited or restricted Self Care Discharge Status [phone](G8989): At least 1 percent but less than 20 percent impaired, limited or restricted   Jeremy Ball, OTR/L [phone] 04/30/2017  Jeremy Ball 04/30/2017, 10:30 AM

## 2017-04-30 NOTE — Anesthesia Postprocedure Evaluation (Signed)
Anesthesia Post Note  Patient: Jeremy Ball  Procedure(s) Performed: Revision microlumbar decompression L5-S1,DISCECTOMY L5,S1 (N/A Back)     Patient location during evaluation: PACU Anesthesia Type: General Level of consciousness: awake and alert Pain management: pain level controlled Vital Signs Assessment: post-procedure vital signs reviewed and stable Respiratory status: spontaneous breathing, nonlabored ventilation, respiratory function stable and patient connected to nasal cannula oxygen Cardiovascular status: blood pressure returned to baseline and stable Postop Assessment: no apparent nausea or vomiting Anesthetic complications: no    Last Vitals:  Vitals:   04/29/17 2045 04/30/17 0227  BP: 137/75 129/75  Pulse: 64 72  Resp: 18 18  Temp: 36.8 C 36.7 C  SpO2: 98% 98%    Last Pain:  Vitals:   04/30/17 0227  TempSrc: Oral  PainSc:                  Jeremy Ball,Jeremy Ball

## 2017-04-30 NOTE — Evaluation (Signed)
Physical Therapy Evaluation Patient Details Name: Jeremy Ball MRN: [id number] DOB: 30-Sep-1985 Today's Date: 04/30/2017   History of Present Illness  s/p L5-S1 decompression and with hx of similar surgery in 2016  Clinical Impression  Pt s/p back surgery and presents with functional mobility limitations 2* post op pain, back precautions and post op numbness L foot.  Pt is currently mobilizing at sup - IND level and plans dc home with assist of family.    Follow Up Recommendations No PT follow up    Equipment Recommendations  None recommended by PT    Recommendations for Other Services       Precautions / Restrictions Precautions Precautions: Back Precaution Booklet Issued: Yes (comment) Restrictions Weight Bearing Restrictions: No      Mobility  Bed Mobility Overal bed mobility: Modified Independent             General bed mobility comments: cues to slow down but technique was performed properly  Transfers Overall transfer level: Needs assistance Equipment used: None Transfers: Sit to/from Stand Sit to Stand: Supervision         General transfer comment: cues for adherence to back precautions  Ambulation/Gait Ambulation/Gait assistance: Independent Ambulation Distance (Feet): 400 Feet Assistive device: None Gait Pattern/deviations: Antalgic   Gait velocity interpretation: at or above normal speed for age/gender General Gait Details: Mildly antalgic gait with pt favoring L LE (left foot numbness following surgery). Pt with good stability but cues to slow pace for safety.  Stairs Stairs: Yes Stairs assistance: Min guard;Supervision Stair Management: One rail Right;Forwards;Alternating pattern Number of Stairs: 4    Wheelchair Mobility    Modified Rankin (Stroke Patients Only)       Balance Overall balance assessment: No apparent balance deficits (not formally assessed)                                           Pertinent  Vitals/Pain Pain Assessment: 0-10 Pain Score: 5  Pain Location: back and L LE to knee Pain Descriptors / Indicators: Sore Pain Intervention(s): Limited activity within patient's tolerance;Monitored during session;Patient requesting pain meds-RN notified    Home Living Family/patient expects to be discharged to:: Private residence Living Arrangements: Spouse/significant other;Children Available Help at Discharge: Family Type of Home: House       Home Layout: Two level Home Equipment: None      Prior Function Level of Independence: Independent               Hand Dominance        Extremity/Trunk Assessment   Upper Extremity Assessment Upper Extremity Assessment: Overall WFL for tasks assessed    Lower Extremity Assessment Lower Extremity Assessment: LLE deficits/detail LLE Deficits / Details: Pt reports numbness in L foot not present prior to surgery       Communication   Communication: No difficulties  Cognition Arousal/Alertness: Awake/alert Behavior During Therapy: WFL for tasks assessed/performed Overall Cognitive Status: Within Functional Limits for tasks assessed                                        General Comments      Exercises     Assessment/Plan    PT Assessment Patent does not need any further PT services  PT Problem List Pain;Decreased strength;Decreased activity tolerance;Decreased  mobility;Decreased safety awareness       PT Treatment Interventions DME instruction;Gait training;Stair training;Functional mobility training;Therapeutic activities;Patient/family education    PT Goals (Current goals can be found in the Care Plan section)  Acute Rehab PT Goals Patient Stated Goal: return to gym    Frequency Min 1X/week   Barriers to discharge        Co-evaluation               AM-PAC PT "6 Clicks" Daily Activity  Outcome Measure Difficulty turning over in bed (including adjusting bedclothes, sheets and  blankets)?: A Little Difficulty moving from lying on back to sitting on the side of the bed? : A Little Difficulty sitting down on and standing up from a chair with arms (e.g., wheelchair, bedside commode, etc,.)?: A Little Help needed moving to and from a bed to chair (including a wheelchair)?: None Help needed walking in hospital room?: None Help needed climbing 3-5 steps with a railing? : A Little 6 Click Score: 20    End of Session   Activity Tolerance: Patient tolerated treatment well Patient left: in bed;with call bell/phone within reach Nurse Communication: Mobility status PT Visit Diagnosis: Difficulty in walking, not elsewhere classified (R26.2)    Time: [id number] PT Time Calculation (min) (ACUTE ONLY): 20 min   Charges:   PT Evaluation $PT Eval Low Complexity: 1 Low     PT G Codes:        Pg [phone]   Jeremy Ball 04/30/2017, 10:43 AM

## 2017-04-30 NOTE — Progress Notes (Signed)
No PT/OT follow up recommended. Pt declines DME per OT note. Sandford Crazeora Geoffrey Mankin RN,BSN,NCM [phone]

## 2017-04-30 NOTE — Progress Notes (Signed)
Patient discharged to home with wife. Given all belongings, instructions, prescriptions. Wife present for all teaching. Both verbalized understanding of instructions. Patient adamantly refused wheelchair. Escorted to front entry.

## 2017-04-30 NOTE — Discharge Summary (Signed)
Physician Discharge Summary   Patient ID: Jeremy Ball MRN: [id number] DOB/AGE: [birth year] 32 y.o.  Admit date: 04/29/2017 Discharge date: 04/30/2017  Primary Diagnosis:   Recurrent HNP, degenerative disc disease L5-S1  Admission Diagnoses:  Past Medical History:  Diagnosis Date  . Headache    Migraines history of  . PONV (postoperative nausea and vomiting)   . Sciatica    Discharge Diagnoses:   Active Problems:   HNP (herniated nucleus pulposus), lumbar  Procedure:  Procedure(s) (LRB): Revision microlumbar decompression L5-S1,DISCECTOMY L5,S1 (N/A)   Consults: None  HPI:  see H&P    Laboratory Data: Hospital Outpatient Visit on 04/28/2017  Component Date Value Ref Range Status  . Sodium 04/28/2017 137  135 - 145 mmol/L Final  . Potassium 04/28/2017 4.4  3.5 - 5.1 mmol/L Final  . Chloride 04/28/2017 101  101 - 111 mmol/L Final  . CO2 04/28/2017 30  22 - 32 mmol/L Final  . Glucose, Bld 04/28/2017 92  65 - 99 mg/dL Final  . BUN 04/28/2017 18  6 - 20 mg/dL Final  . Creatinine, Ser 04/28/2017 1.14  0.61 - 1.24 mg/dL Final  . Calcium 04/28/2017 9.5  8.9 - 10.3 mg/dL Final  . GFR calc non Af Amer 04/28/2017 >60  >60 mL/min Final  . GFR calc Af Amer 04/28/2017 >60  >60 mL/min Final   Comment: (NOTE) The eGFR has been calculated using the CKD EPI equation. This calculation has not been validated in all clinical situations. eGFR's persistently <60 mL/min signify possible Chronic Kidney Disease.   . Anion gap 04/28/2017 6  5 - 15 Final  . WBC 04/28/2017 8.8  4.0 - 10.5 K/uL Final  . RBC 04/28/2017 5.64  4.22 - 5.81 MIL/uL Final  . Hemoglobin 04/28/2017 16.5  13.0 - 17.0 g/dL Final  . HCT 04/28/2017 49.8  39.0 - 52.0 % Final  . MCV 04/28/2017 88.3  78.0 - 100.0 fL Final  . MCH 04/28/2017 29.3  26.0 - 34.0 pg Final  . MCHC 04/28/2017 33.1  30.0 - 36.0 g/dL Final  . RDW 04/28/2017 12.8  11.5 - 15.5 % Final  . Platelets 04/28/2017 262  150 - 400 K/uL Final  . MRSA,  PCR 04/28/2017 NEGATIVE  NEGATIVE Final  . Staphylococcus aureus 04/28/2017 NEGATIVE  NEGATIVE Final   Comment: (NOTE) The Xpert SA Assay (FDA approved for NASAL specimens in patients 9 years of age and older), is one component of a comprehensive surveillance program. It is not intended to diagnose infection nor to guide or monitor treatment.    Recent Labs    04/28/17 1525  HGB 16.5   Recent Labs    04/28/17 1525  WBC 8.8  RBC 5.64  HCT 49.8  PLT 262   Recent Labs    04/28/17 1525 04/30/17 0552  NA 137 135  K 4.4 4.4  CL 101 100*  CO2 30 26  BUN 18 17  CREATININE 1.14 1.03  GLUCOSE 92 144*  CALCIUM 9.5 9.3   No results for input(s): LABPT, INR in the last 72 hours.  X-Rays:Dg Lumbar Spine 2-3 Views  Result Date: 04/29/2017 CLINICAL DATA:  Herniated nucleus pulposus. Preop spinal stenosis L4-L5 and L5-S1. EXAM: LUMBAR SPINE - 2-3 VIEW COMPARISON:  Lumbar spine radiographs 11/10/2014 FINDINGS: Leftward curvature of the upper lumbar spine is new from prior exam. No listhesis. Disc space narrowing at L5-S1 with endplate spurring. There is straightening of normal lordosis. Vertebral body heights are normal. Lumbar levels numbered, 5 non-rib-bearing lumbar  vertebra. IMPRESSION: Five non-rib-bearing lumbar vertebra. New scoliotic curvature of the upper lumbar spine since 2016. Disc space narrowing and endplate spurring at B1-Y7. Electronically Signed   By: Jeb Levering [name]   On: 04/29/2017 02:40   Dg Spine Portable 1 View  Result Date: 04/29/2017 CLINICAL DATA:  Intraoperative localization radiograph. EXAM: PORTABLE SPINE - 1 VIEW COMPARISON:  Lumbar spine series of April 28, 2017 FINDINGS: The metallic probes project over the posterior aspect of the L5-S1 disc space. There is moderate disc space narrowing here and milder narrowing at L4-5. IMPRESSION: Localization radiograph revealing the metallic probes overlying the posterior aspect of the L5-S1 disc space.  Electronically Signed   By: David  Martinique [name]   On: 04/29/2017 15:00   Dg Spine Portable 1 View  Result Date: 04/29/2017 CLINICAL DATA:  Intraoperative lateral lumbar spine radiograph for localization numbered #1. EXAM: PORTABLE SPINE - 1 VIEW COMPARISON:  AP and lateral views of the lumbar spine of November 10, 2014 and April 28, 2017. FINDINGS: The upper metallic needle projects 6.8 cm posterior to the posterosuperior aspect of the body of L5. The lower needle measures approximately 9 cm posterior to the posterosuperior aspect of the body of S1. IMPRESSION: Lateral localization radiograph with needle positions as described. Electronically Signed   By: David  Martinique [name]   On: 04/29/2017 14:22   Dg Spine Portable 1 View  Result Date: 04/29/2017 CLINICAL DATA:  Lumbar disc herniation. Prior L4 and L5 posterior decompression. EXAM: INTRAOPERATIVE PORTABLE SPINE - 1 VIEW 1:23 p.m.: COMPARISON:  Preoperative lumbar spine x-rays yesterday and intraoperative lumbar spine x-rays 11/10/2014. FINDINGS: Prior imaging demonstrated 5 non-rib-bearing lumbar vertebrae. Patient has undergone prior decompression at inferior L4 and at L5, and the L5 spinous process has been removed. These L1 through L4 spinous processes are numbered on the image. The localizer needles are in the soft tissues posterior to L5 and S1. IMPRESSION: Localizer needles in the soft tissues posterior to L5 and S1. (Prior L5 decompression and the L5 spinous process has been removed). Electronically Signed   By: Evangeline Dakin [name]   On: 04/29/2017 13:55    EKG:No orders found for this or any previous visit.   Hospital Course: Patient was admitted to [facility] and taken to the OR and underwent the above state procedure without complications.  Patient tolerated the procedure well and was later transferred to the recovery room and then to the orthopaedic floor for postoperative care.  They were given PO and IV analgesics for pain  control following their surgery.  They were given 24 hours of postoperative antibiotics.   PT was consulted postop to assist with mobility and transfers.  The patient was allowed to be WBAT with therapy and was taught back precautions. Discharge planning was consulted to help with postop disposition and equipment needs.  Patient had a good night on the evening of surgery and started to get up OOB with therapy on day one. Patient was seen in rounds and was ready to go home on day one.  They were given discharge instructions and dressing directions.  They were instructed on when to follow up in the office with Dr. Tonita Cong.   Diet: Regular diet Activity:WBAT; Lspine precautions Follow-up:in 10-14 days Disposition - Home Discharged Condition: good   Discharge Instructions    Call MD / Call 911   Complete by:  As directed    If you experience chest pain or shortness of breath, CALL 911 and be transported  to the hospital emergency room.  If you develope a fever above 101 F, pus (white drainage) or increased drainage or redness at the wound, or calf pain, call your surgeon's office.   Constipation Prevention   Complete by:  As directed    Drink plenty of fluids.  Prune juice may be helpful.  You may use a stool softener, such as Colace (over the counter) 100 mg twice a day.  Use MiraLax (over the counter) for constipation as needed.   Diet - low sodium heart healthy   Complete by:  As directed    Increase activity slowly as tolerated   Complete by:  As directed      Allergies as of 04/30/2017   No Known Allergies     Medication List    STOP taking these medications   predniSONE 5 MG (21) Tbpk tablet Commonly known as:  STERAPRED UNI-PAK 21 TAB     TAKE these medications   BENGAY EX Apply 1 application topically 3 (three) times daily as needed (for pain/discomfort.).   docusate sodium 100 MG capsule Commonly known as:  COLACE Take 1 capsule (100 mg total) by mouth 2 (two) times daily.    gabapentin 300 MG capsule Commonly known as:  NEURONTIN Take 2-3 capsules (600-900 mg total) by mouth 3 (three) times daily. What changed:  how much to take   ibuprofen 200 MG tablet Commonly known as:  ADVIL,MOTRIN Take 2-4 tablets (400-800 mg total) by mouth every 8 (eight) hours as needed (for pain.). Resume 5 days post-op as needed What changed:  additional instructions   methocarbamol 500 MG tablet Commonly known as:  ROBAXIN Take 1 tablet (500 mg total) by mouth every 6 (six) hours as needed for muscle spasms.   oxyCODONE 5 MG immediate release tablet Commonly known as:  Oxy IR/ROXICODONE Take 1-2 tablets (5-10 mg total) by mouth every 4 (four) hours as needed for severe pain (for pain).   polyethylene glycol packet Commonly known as:  MIRALAX / GLYCOLAX Take 17 g by mouth daily as needed for mild constipation.      Follow-up Information    Susa Day, MD Follow up in 2 week(s).   Specialty:  Orthopedic Surgery Contact information: [address] [place] [address] [phone]        Susa Day, MD In 2 weeks.   Specialty:  Orthopedic Surgery Contact information: [address] [place] [address] [phone]           Signed: Lacie Draft, PA-C Orthopaedic Surgery 04/30/2017, 3:58 PM

## 2017-04-30 NOTE — Progress Notes (Signed)
Subjective: 1 Day Post-Op Procedure(s) (LRB): Revision microlumbar decompression L5-S1,DISCECTOMY L5,S1 (N/A) Patient reports pain as moderate.  Reports minimal back pain. Noting buttock/thigh pain (improved from pre-op) and new numbness in the lateral L foot. No other c/o. Severe pain from PACU improved.  Objective: Vital signs in last 24 hours: Temp:  [97.7 F (36.5 C)-98.2 F (36.8 C)] 97.7 F (36.5 C) (12/21 0542) Pulse Rate:  [64-102] 66 (12/21 0542) Resp:  [12-24] 18 (12/21 0542) BP: (93-150)/(57-86) 150/75 (12/21 0542) SpO2:  [93 %-100 %] 98 % (12/21 0542) Weight:  [138.8 kg (306 lb)] 138.8 kg (306 lb) (12/20 1052)  Intake/Output from previous day: 12/20 0701 - 12/21 0700 In: 3693.3 [P.O.:600; I.V.:2688.3; IV Piggyback:405] Out: 2925 [Urine:2875; Blood:50] Intake/Output this shift: Total I/O In: 240 [P.O.:240] Out: -   Recent Labs    04/28/17 1525  HGB 16.5   Recent Labs    04/28/17 1525  WBC 8.8  RBC 5.64  HCT 49.8  PLT 262   Recent Labs    04/28/17 1525 04/30/17 0552  NA 137 135  K 4.4 4.4  CL 101 100*  CO2 30 26  BUN 18 17  CREATININE 1.14 1.03  GLUCOSE 92 144*  CALCIUM 9.5 9.3   No results for input(s): LABPT, INR in the last 72 hours.  Appears anxious, uncomfortable, changes positions often Neurologically intact ABD soft Neurovascular intact Sensation intact distally Intact pulses distally Dorsiflexion/Plantar flexion intact Incision: dressing C/D/I and no drainage No cellulitis present Compartment soft no calf pain or sign of DVT  Assessment/Plan: 1 Day Post-Op Procedure(s) (LRB): Revision microlumbar decompression L5-S1,DISCECTOMY L5,S1 (N/A) Advance diet Up with therapy D/C IV fluids  Will discuss with Dr. Shelle IronBeane, plan to increase gabapentin May benefit from anxiolytic If pain well controlled later today plan for D/C home  Jeremy Ball M. 04/30/2017, 9:19 AM

## 2017-05-05 NOTE — Progress Notes (Signed)
   04/30/17 1030  PT Time Calculation  PT Start Time (ACUTE ONLY) 0815  PT Stop Time (ACUTE ONLY) 0835  PT Time Calculation (min) (ACUTE ONLY) 20 min  PT G-Codes **NOT FOR INPATIENT CLASS**  Functional Assessment Tool Used Clinical judgement  Functional Limitation Mobility: Walking and moving around  Mobility: Walking and Moving Around Current Status (W0981(G8978) CI  Mobility: Walking and Moving Around Goal Status (X9147(G8979) CI  Mobility: Walking and Moving Around Discharge Status (W2956(G8980) CI  PT General Charges  $$ ACUTE PT VISIT 1 Visit  PT Evaluation  $PT Eval Low Complexity 1 Low

## 2017-05-05 NOTE — Brief Op Note (Signed)
04/29/2017  7:14 AM  PATIENT:  Laretta AlstromZachary Brod  31 y.o. male  PRE-OPERATIVE DIAGNOSIS:  Recurrent HNP, degenerative disc disease L5-S1  POST-OPERATIVE DIAGNOSIS:  Recurrent HNP, degenerative disc disease L5-S1  PROCEDURE:  Procedure(s) with comments: Revision microlumbar decompression L5-S1,DISCECTOMY L5,S1 (N/A) - 2hrs  SURGEON:  Surgeon(s) and Role:    * Jene EveryBeane, Neely Kammerer, MD - Primary    Ranee Gosselin* Gioffre, Ronald, MD - Assisting  PHYSICIAN ASSISTANT:   ASSISTANTS: Gioffree   ANESTHESIA:   general  EBL:  50 mL   BLOOD ADMINISTERED:none  DRAINS: none   LOCAL MEDICATIONS USED:  MARCAINE     SPECIMEN:  No Specimen and Source of Specimen:  L5S1  DISPOSITION OF SPECIMEN:  PATHOLOGY  COUNTS:  YES  TOURNIQUET:  * No tourniquets in log *  DICTATION: .Other Dictation: Dictation Number [id number]  PLAN OF CARE: Admit for overnight observation  PATIENT DISPOSITION:  PACU - hemodynamically stable.   Delay start of Pharmacological VTE agent (>24hrs) due to surgical blood loss or risk of bleeding: yes

## 2017-05-05 NOTE — Op Note (Signed)
NAMLaretta Alstrom:  Hoiland, Trestin              ACCOUNT NO.:  [id number]  MEDICAL RECORD NO.:  [id number]  LOCATION:  1610                         [facility]  PHYSICIAN:  Jene EveryJeffrey Quindon Denker, [name]    DATE OF BIRTH:  1985-09-18  DATE OF PROCEDURE:  04/29/2017 DATE OF DISCHARGE:                              OPERATIVE REPORT   PREOPERATIVE DIAGNOSES: 1. Recurrent disk herniation, spinal stenosis, L5-S1, left. 2. Elevated body mass index.  POSTOPERATIVE DIAGNOSES: 1. Recurrent disk herniation, spinal stenosis, L5-S1, left. 2. Elevated body mass index.  PROCEDURE PERFORMED: 1. Revision microlumbar decompression, L5-S1, left. 2. Foraminotomy, S1, left. 3. Microdiskectomy, L5-S1, left.  ANESTHESIA:  General.  ASSISTANT:  Dr. Crist Infanteon Taeshawn Helfman.  SPECIMEN:  To Pathology.  HISTORY:  A 31 year old with recurrent disk herniation at L5-S1 compressing the S1 nerve root, failing conservative treatment, neural tension signs, EHL weakness, and plantar flexion weakness.  He was indicated for microlumbar decompression.  Risks and benefits discussed including bleeding, infection, damage to neurovascular structures, no change in symptoms, worsening symptoms, DVT, PE, anesthetic complications, etc.  Also increased time required to recovery due to the revision, possible fusion in the future.  He had no back pain.  TECHNIQUE:  The patient in supine position after induction of adequate anesthesia and 3 g Kefzol, placed prone on the [place] frame.  All bony prominences were well padded.  Foley to gravity.  Lumbar region was prepped and draped in usual sterile fashion.  Two 18-gauge spinal needles were utilized to localize L5-S1 interspace, confirmed with an x- ray.  Previous incision was used to excise the scar.  Subcutaneous tissue was dissected.  Electrocautery was utilized to achieve hemostasis.  Dorsolumbar fascia was divided in line with skin incision. Paraspinous muscle was identified.  There was no  interspinous ligament to the previous central hemilaminectomy, making localization without typical bony prominence.  We have identified the facet at L5-S1.  In the safe space of the facet at L5-S1, the previous laminotomy on the left was skeletonized to the point just above the disk space confirmed by x- ray.  The paraspinous musculature was retracted utilizing a McCullough. Extensive fibrosis was noted.  We enlarged the foramen of S1, did not go distally due to a cyst.  Partial medial facetectomy was performed on the inferior aspect of the superior articulating surface preserving the pars on the inferior portion of L5.  We identified the nerve root, gently mobilized it medially, decompressed lateral recess to medial border of the pedicle.  Focal HNP was noted.  There was some osteophytic projections noted here due to the disk degeneration.  I performed an annulotomy.  Copious portion of disk material was removed from the disk space and the subannular space with a nerve hook and micropituitary. This was sent to Pathology.  Into the disk space with a micropituitary, irrigated disks portions were removed.  We extended that into the foramen of L5.  Foramina L5 was open.  The neural probe passed freely at the foramen of L5.  Following the mobilization and decompression, there was 1 cm of excursion in the S1 nerve root medial to pedicle without tension.  Prior to this, we spent meticulous time using a  microcurette, developing a plane between the facet and previous nerve root, gently protecting it at all times.  There was no sustained or significant traction applied to the S1 nerve root.  Confirmatory radiograph obtained.  Neuro probe passed freely at the foramen of L5 and S1 above the pedicle of L5 without tension.  There was no residual disk herniation beneath the thecal sac, the axilla, or the shoulder of the root.  There were erythematous and edematous, but no active bleeding or CSF  leakage.  Thrombin-soaked Gelfoam was placed in laminotomy defect. We removed the [facility] ret[facility].  Paraspinous muscles were inspected.  No evidence of active bleeding.  We closed the dorsolumbar fascia with 1 Vicryl interrupted figure-of-eight suture, subcu with multiple 2-0's, and skin with staples.  Wound was dressed sterilely, placed supine on the hospital bed, extubated without difficulty, and transported to the recovery room in satisfactory condition.  The patient tolerated the procedure well.  There were no complications. Again, elevated BMI increased technical difficulty.  His kilogram weight was 139.     Jene EveryJeffrey Marina Desire, [name]     Cordelia PenJB/MEDQ  D:  05/05/2017  T:  05/05/2017  Job:  [id number]

## 2017-05-07 NOTE — Op Note (Signed)
NAMLaretta Alstrom:  Ball, Jeremy              ACCOUNT NO.:  [id number]  MEDICAL RECORD NO.:  [id number]  LOCATION:                                 FACILITY:  PHYSICIAN:  [name], [name]    DATE OF BIRTH:  09/25/1985  DATE OF PROCEDURE:  04/29/2017 DATE OF DISCHARGE:                              OPERATIVE REPORT   ADDENDUM:  ASSISTANT:  Dr. [name].     [name], [name]   ______________________________ [name], [name]    Cordelia PenJB/MEDQ  D:  05/06/2017  T:  05/07/2017  Job:  [id number]

## 2019-03-26 IMAGING — DX DG LUMBAR SPINE 2-3V
3 series · 3 of 3 positions shown · non-contrast
Comparison: Lumbar spine radiographs 11/10/2014

CLINICAL DATA: Herniated nucleus pulposus. Preop spinal stenosis
L4-L5 and L5-S1.

EXAM:
LUMBAR SPINE - 2-3 VIEW

[l-spine ap]
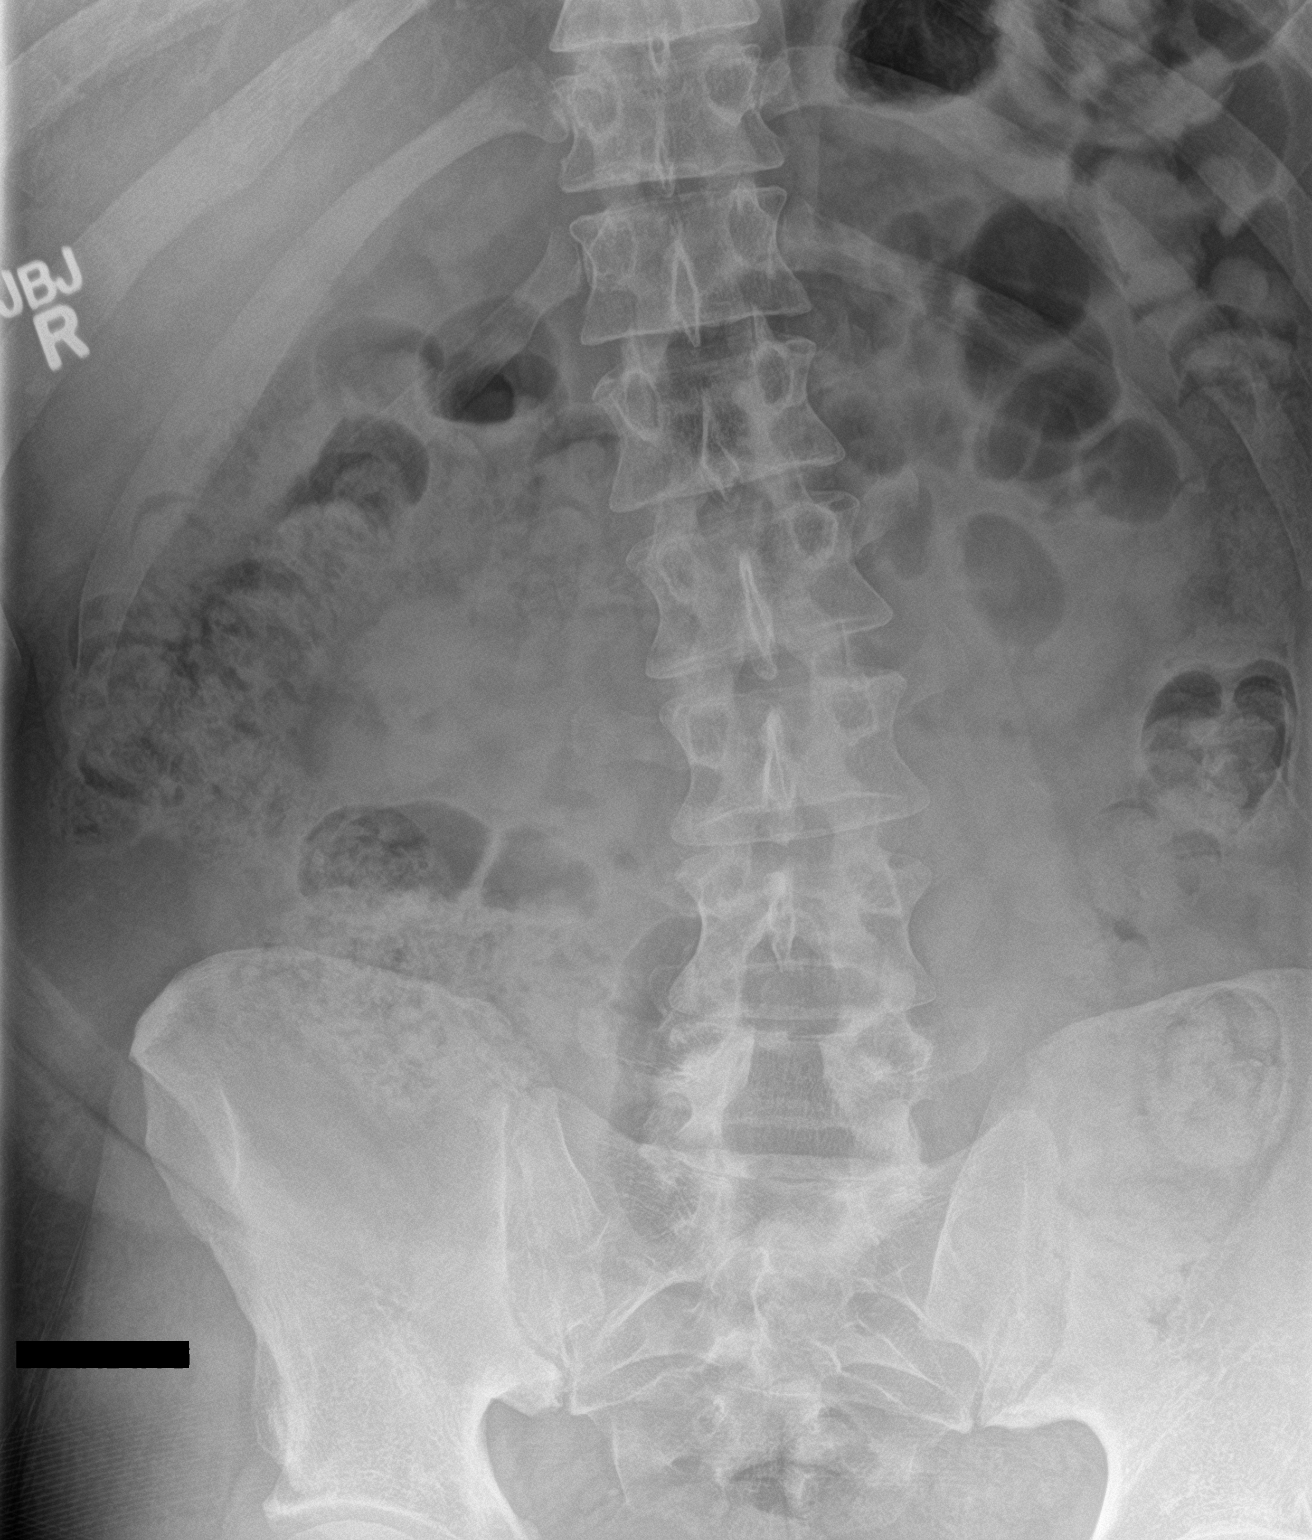

[l-spine lat]
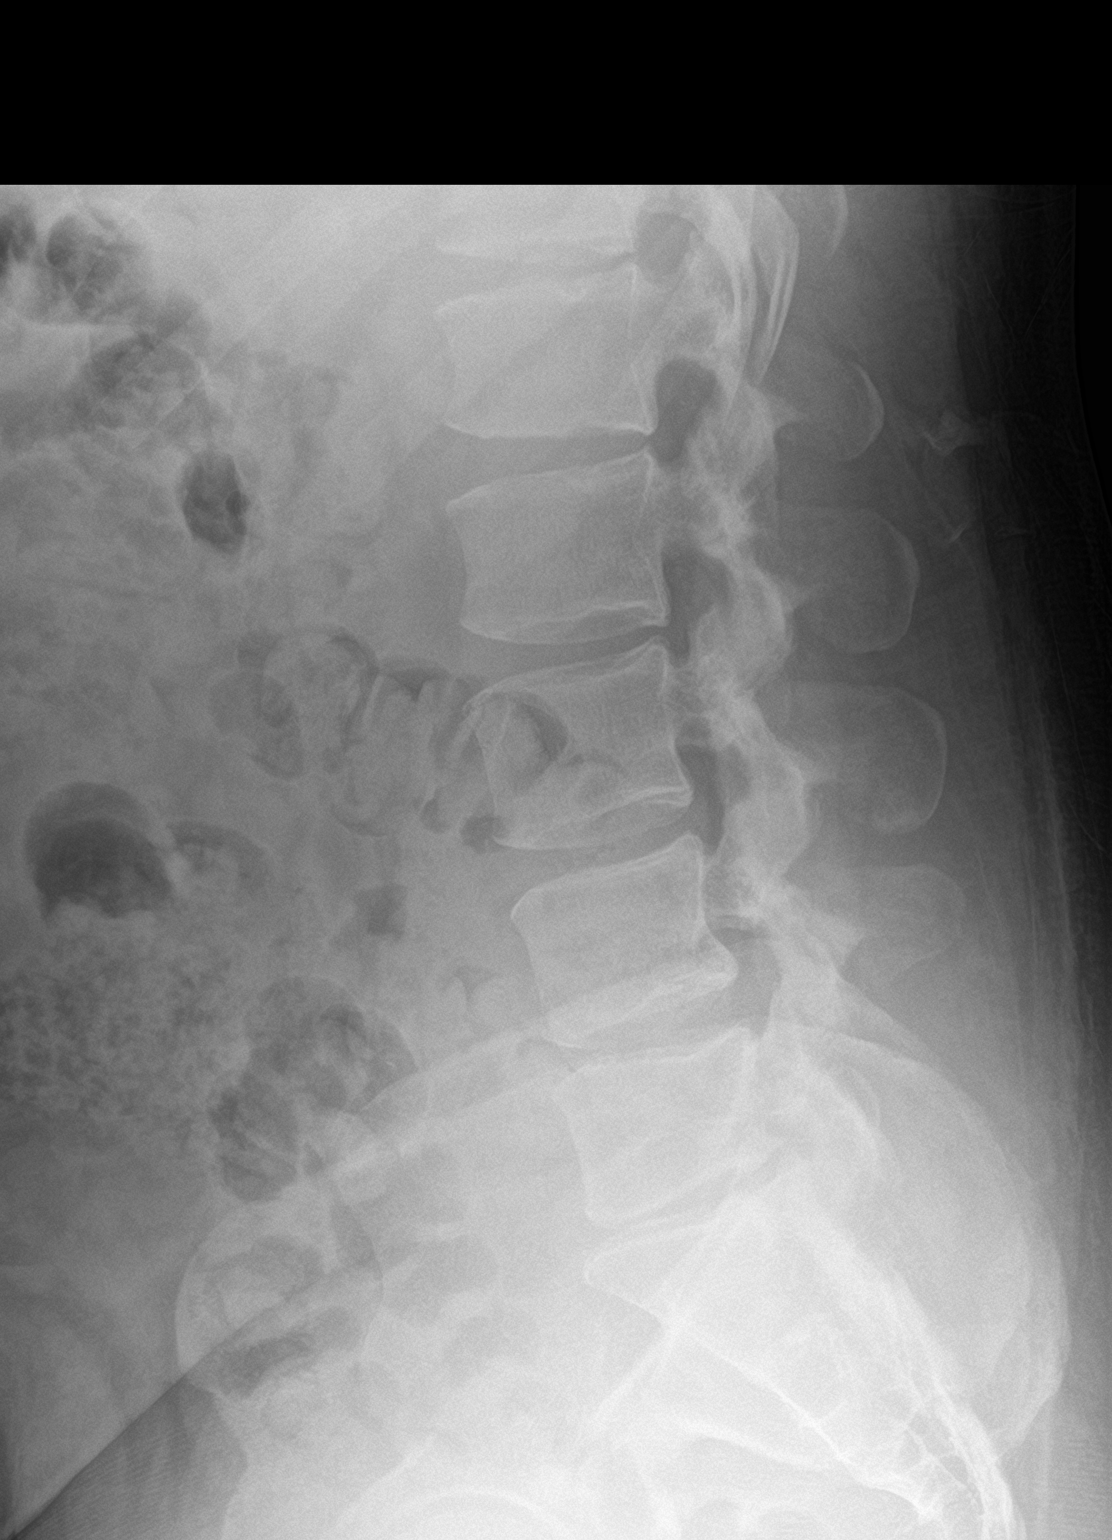

[l-spine spot]
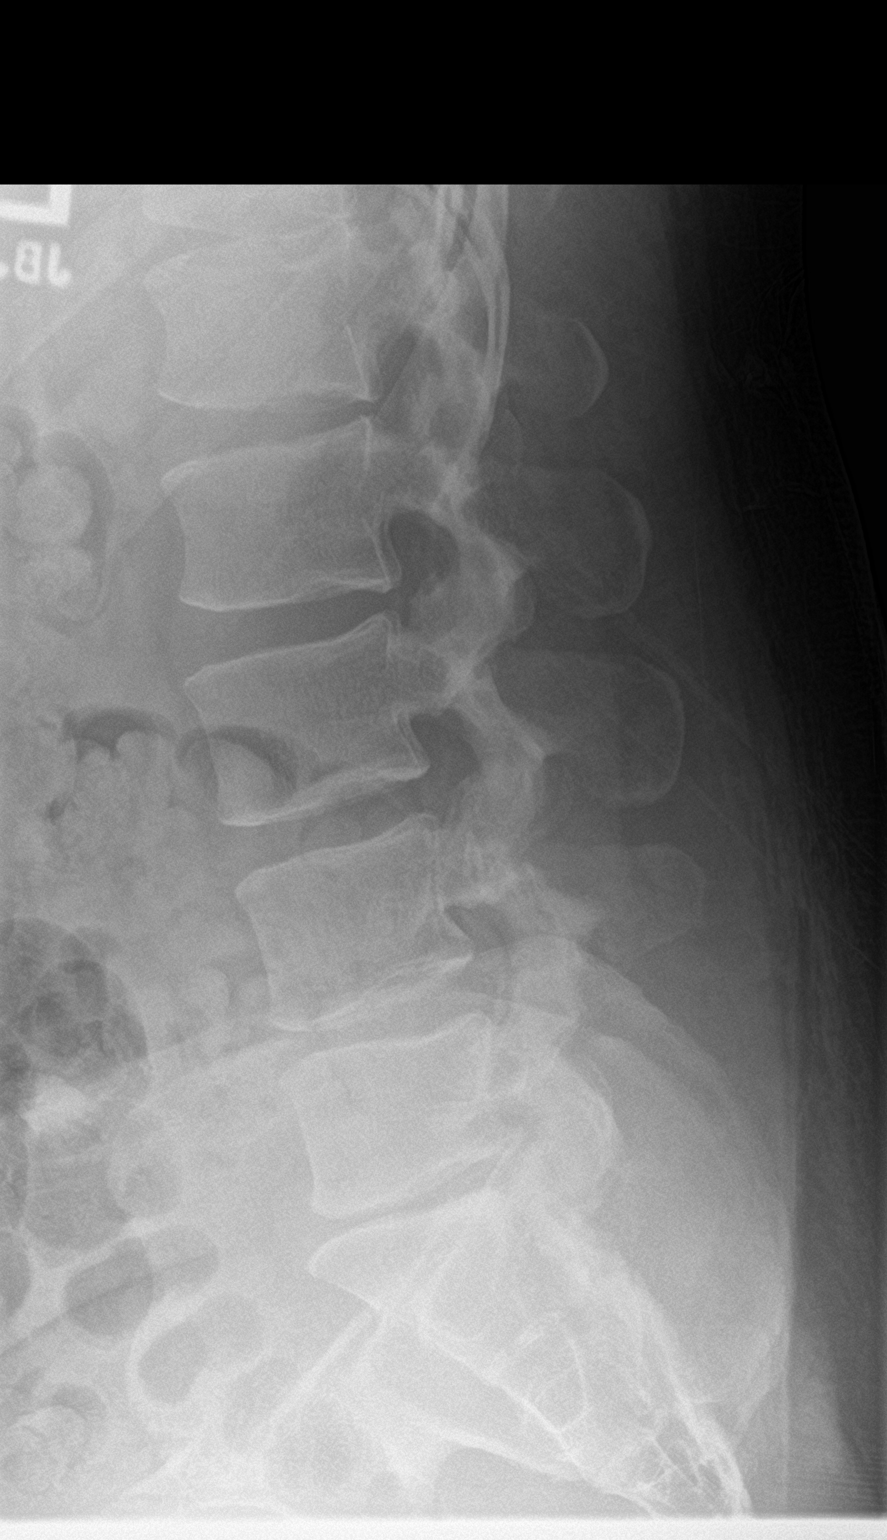

[3 of 3 positions shown; findings below may reference images not displayed]

FINDINGS: Leftward curvature of the upper lumbar spine is new from prior exam.
No listhesis. Disc space narrowing at L5-S1 with endplate spurring.
There is straightening of normal lordosis. Vertebral body heights
are normal. Lumbar levels numbered, 5 non-rib-bearing lumbar
vertebra.
IMPRESSION: Five non-rib-bearing lumbar vertebra. New scoliotic curvature of the
upper lumbar spine since 8408. Disc space narrowing and endplate
spurring at L5-S1.
